# Patient Record
Sex: Male | Born: 1950 | Race: White | Hispanic: No | Marital: Single | Smoking: Never smoker
Health system: Southern US, Community
[De-identification: ages and names within clinical notes are randomized; demographics above are authoritative.]

## PROBLEM LIST (undated history)

## (undated) DIAGNOSIS — K219 Gastro-esophageal reflux disease without esophagitis: Secondary | ICD-10-CM

## (undated) DIAGNOSIS — E6609 Other obesity due to excess calories: Secondary | ICD-10-CM

## (undated) DIAGNOSIS — J189 Pneumonia, unspecified organism: Secondary | ICD-10-CM

## (undated) DIAGNOSIS — E785 Hyperlipidemia, unspecified: Secondary | ICD-10-CM

## (undated) DIAGNOSIS — I10 Essential (primary) hypertension: Secondary | ICD-10-CM

## (undated) DIAGNOSIS — M109 Gout, unspecified: Secondary | ICD-10-CM

## (undated) DIAGNOSIS — N189 Chronic kidney disease, unspecified: Secondary | ICD-10-CM

## (undated) DIAGNOSIS — J9602 Acute respiratory failure with hypercapnia: Secondary | ICD-10-CM

## (undated) HISTORY — PX: COLONOSCOPY: SHX174

## (undated) HISTORY — PX: TONSILLECTOMY: SUR1361

## (undated) HISTORY — PX: SHOULDER SURGERY: SHX246

---

## 2007-05-08 ENCOUNTER — Ambulatory Visit: Payer: Self-pay | Admitting: Gastroenterology

## 2010-07-05 ENCOUNTER — Ambulatory Visit: Payer: Self-pay | Admitting: Gastroenterology

## 2010-07-09 LAB — PATHOLOGY REPORT

## 2010-11-08 ENCOUNTER — Encounter
Admission: RE | Admit: 2010-11-08 | Discharge: 2010-11-08 | Payer: Self-pay | Source: Home / Self Care | Attending: Orthopedic Surgery | Admitting: Orthopedic Surgery

## 2010-11-29 ENCOUNTER — Ambulatory Visit: Payer: Self-pay | Admitting: Orthopedic Surgery

## 2013-09-23 ENCOUNTER — Ambulatory Visit: Payer: Self-pay | Admitting: Gastroenterology

## 2013-09-24 LAB — PATHOLOGY REPORT

## 2016-02-10 DIAGNOSIS — J9602 Acute respiratory failure with hypercapnia: Secondary | ICD-10-CM

## 2016-02-10 DIAGNOSIS — J189 Pneumonia, unspecified organism: Secondary | ICD-10-CM

## 2016-02-10 HISTORY — DX: Acute respiratory failure with hypercapnia: J96.02

## 2016-02-10 HISTORY — DX: Pneumonia, unspecified organism: J18.9

## 2016-02-23 ENCOUNTER — Inpatient Hospital Stay: Payer: No Typology Code available for payment source

## 2016-02-23 ENCOUNTER — Emergency Department: Payer: No Typology Code available for payment source

## 2016-02-23 ENCOUNTER — Inpatient Hospital Stay
Admission: EM | Admit: 2016-02-23 | Discharge: 2016-02-23 | DRG: 871 | Disposition: A | Discharge status: Other Acute Inpatient Hospital | Payer: No Typology Code available for payment source | Attending: Internal Medicine | Admitting: Internal Medicine

## 2016-02-23 ENCOUNTER — Inpatient Hospital Stay (HOSPITAL_COMMUNITY)
Admission: AD | Admit: 2016-02-23 | Discharge: 2016-03-07 | DRG: 207 | Disposition: A | Discharge status: Skilled Nursing Facility | Payer: No Typology Code available for payment source | Source: Other Acute Inpatient Hospital | Attending: Internal Medicine | Admitting: Internal Medicine

## 2016-02-23 DIAGNOSIS — Z808 Family history of malignant neoplasm of other organs or systems: Secondary | ICD-10-CM | POA: Diagnosis not present

## 2016-02-23 DIAGNOSIS — Z803 Family history of malignant neoplasm of breast: Secondary | ICD-10-CM

## 2016-02-23 DIAGNOSIS — E662 Morbid (severe) obesity with alveolar hypoventilation: Secondary | ICD-10-CM | POA: Diagnosis not present

## 2016-02-23 DIAGNOSIS — M1A9XX0 Chronic gout, unspecified, without tophus (tophi): Secondary | ICD-10-CM | POA: Diagnosis present

## 2016-02-23 DIAGNOSIS — I10 Essential (primary) hypertension: Secondary | ICD-10-CM | POA: Diagnosis present

## 2016-02-23 DIAGNOSIS — J9811 Atelectasis: Secondary | ICD-10-CM | POA: Diagnosis present

## 2016-02-23 DIAGNOSIS — J9602 Acute respiratory failure with hypercapnia: Secondary | ICD-10-CM | POA: Diagnosis not present

## 2016-02-23 DIAGNOSIS — A419 Sepsis, unspecified organism: Secondary | ICD-10-CM | POA: Diagnosis not present

## 2016-02-23 DIAGNOSIS — R6521 Severe sepsis with septic shock: Secondary | ICD-10-CM | POA: Diagnosis not present

## 2016-02-23 DIAGNOSIS — I1310 Hypertensive heart and chronic kidney disease without heart failure, with stage 1 through stage 4 chronic kidney disease, or unspecified chronic kidney disease: Secondary | ICD-10-CM | POA: Diagnosis present

## 2016-02-23 DIAGNOSIS — J9601 Acute respiratory failure with hypoxia: Secondary | ICD-10-CM | POA: Diagnosis present

## 2016-02-23 DIAGNOSIS — N179 Acute kidney failure, unspecified: Secondary | ICD-10-CM | POA: Diagnosis present

## 2016-02-23 DIAGNOSIS — E872 Acidosis: Secondary | ICD-10-CM | POA: Diagnosis present

## 2016-02-23 DIAGNOSIS — J189 Pneumonia, unspecified organism: Secondary | ICD-10-CM | POA: Diagnosis not present

## 2016-02-23 DIAGNOSIS — M109 Gout, unspecified: Secondary | ICD-10-CM | POA: Diagnosis present

## 2016-02-23 DIAGNOSIS — K5900 Constipation, unspecified: Secondary | ICD-10-CM | POA: Diagnosis present

## 2016-02-23 DIAGNOSIS — I509 Heart failure, unspecified: Secondary | ICD-10-CM | POA: Diagnosis not present

## 2016-02-23 DIAGNOSIS — N183 Chronic kidney disease, stage 3 unspecified: Secondary | ICD-10-CM | POA: Insufficient documentation

## 2016-02-23 DIAGNOSIS — J441 Chronic obstructive pulmonary disease with (acute) exacerbation: Secondary | ICD-10-CM | POA: Diagnosis present

## 2016-02-23 DIAGNOSIS — K219 Gastro-esophageal reflux disease without esophagitis: Secondary | ICD-10-CM | POA: Diagnosis present

## 2016-02-23 DIAGNOSIS — J9600 Acute respiratory failure, unspecified whether with hypoxia or hypercapnia: Secondary | ICD-10-CM | POA: Diagnosis present

## 2016-02-23 DIAGNOSIS — J440 Chronic obstructive pulmonary disease with acute lower respiratory infection: Principal | ICD-10-CM | POA: Diagnosis present

## 2016-02-23 DIAGNOSIS — E876 Hypokalemia: Secondary | ICD-10-CM

## 2016-02-23 DIAGNOSIS — Z91030 Bee allergy status: Secondary | ICD-10-CM

## 2016-02-23 DIAGNOSIS — G4733 Obstructive sleep apnea (adult) (pediatric): Secondary | ICD-10-CM | POA: Diagnosis present

## 2016-02-23 DIAGNOSIS — Z6841 Body Mass Index (BMI) 40.0 and over, adult: Secondary | ICD-10-CM

## 2016-02-23 DIAGNOSIS — E785 Hyperlipidemia, unspecified: Secondary | ICD-10-CM | POA: Diagnosis present

## 2016-02-23 DIAGNOSIS — E669 Obesity, unspecified: Secondary | ICD-10-CM | POA: Diagnosis not present

## 2016-02-23 DIAGNOSIS — Z79899 Other long term (current) drug therapy: Secondary | ICD-10-CM

## 2016-02-23 DIAGNOSIS — R0602 Shortness of breath: Secondary | ICD-10-CM

## 2016-02-23 DIAGNOSIS — I517 Cardiomegaly: Secondary | ICD-10-CM | POA: Diagnosis present

## 2016-02-23 DIAGNOSIS — J95851 Ventilator associated pneumonia: Secondary | ICD-10-CM

## 2016-02-23 DIAGNOSIS — R0902 Hypoxemia: Secondary | ICD-10-CM

## 2016-02-23 DIAGNOSIS — I129 Hypertensive chronic kidney disease with stage 1 through stage 4 chronic kidney disease, or unspecified chronic kidney disease: Secondary | ICD-10-CM | POA: Diagnosis present

## 2016-02-23 DIAGNOSIS — Z789 Other specified health status: Secondary | ICD-10-CM | POA: Diagnosis not present

## 2016-02-23 DIAGNOSIS — R5381 Other malaise: Secondary | ICD-10-CM | POA: Diagnosis not present

## 2016-02-23 DIAGNOSIS — B372 Candidiasis of skin and nail: Secondary | ICD-10-CM | POA: Diagnosis present

## 2016-02-23 DIAGNOSIS — Z978 Presence of other specified devices: Secondary | ICD-10-CM | POA: Insufficient documentation

## 2016-02-23 HISTORY — DX: Gastro-esophageal reflux disease without esophagitis: K21.9

## 2016-02-23 HISTORY — DX: Morbid (severe) obesity due to excess calories: E66.01

## 2016-02-23 HISTORY — DX: Pneumonia, unspecified organism: J18.9

## 2016-02-23 HISTORY — DX: Gout, unspecified: M10.9

## 2016-02-23 HISTORY — DX: Essential (primary) hypertension: I10

## 2016-02-23 HISTORY — DX: Chronic kidney disease, unspecified: N18.9

## 2016-02-23 HISTORY — DX: Acute respiratory failure with hypercapnia: J96.02

## 2016-02-23 LAB — BLOOD GAS, ARTERIAL
ACID-BASE EXCESS: 8.3 mmol/L — AB (ref 0.0–3.0)
ACID-BASE EXCESS: 6.8 mmol/L — AB (ref 0.0–3.0)
ALLENS TEST (PASS/FAIL): POSITIVE — AB
Acid-Base Excess: 7.7 mmol/L — ABNORMAL HIGH (ref 0.0–3.0)
Allens test (pass/fail): POSITIVE — AB
BICARBONATE: 32.4 meq/L — AB (ref 21.0–28.0)
Bicarbonate: 39.4 mEq/L — ABNORMAL HIGH (ref 21.0–28.0)
Bicarbonate: 40.7 mEq/L — ABNORMAL HIGH (ref 21.0–28.0)
EXPIRATORY PAP: 6
FIO2: 1
FIO2: 1
FIO2: 1
INSPIRATORY PAP: 18
MECHVT: 500 mL
Mechanical Rate: 22
O2 SAT: 97 %
O2 SAT: 98.9 %
O2 Saturation: 89.7 %
PATIENT TEMPERATURE: 37
PATIENT TEMPERATURE: 37
PCO2 ART: 109 mmHg — AB (ref 32.0–48.0)
PEEP/CPAP: 5 cmH2O
PH ART: 7.18 — AB (ref 7.350–7.450)
Patient temperature: 37
pCO2 arterial: 92 mmHg (ref 32.0–48.0)
pCO2 arterial: 50 mmHg — ABNORMAL HIGH (ref 32.0–48.0)
pH, Arterial: 7.24 — ABNORMAL LOW (ref 7.350–7.450)
pH, Arterial: 7.42 (ref 7.350–7.450)
pO2, Arterial: 104 mmHg (ref 83.0–108.0)
pO2, Arterial: 72 mmHg — ABNORMAL LOW (ref 83.0–108.0)
pO2, Arterial: 127 mmHg — ABNORMAL HIGH (ref 83.0–108.0)

## 2016-02-23 LAB — COMPREHENSIVE METABOLIC PANEL
ALT: 18 U/L (ref 17–63)
ANION GAP: 8 (ref 5–15)
AST: 29 U/L (ref 15–41)
Albumin: 3.9 g/dL (ref 3.5–5.0)
Alkaline Phosphatase: 77 U/L (ref 38–126)
BUN: 21 mg/dL — ABNORMAL HIGH (ref 6–20)
CHLORIDE: 98 mmol/L — AB (ref 101–111)
CO2: 34 mmol/L — AB (ref 22–32)
CREATININE: 1.31 mg/dL — AB (ref 0.61–1.24)
Calcium: 8.7 mg/dL — ABNORMAL LOW (ref 8.9–10.3)
GFR calc non Af Amer: 56 mL/min — ABNORMAL LOW (ref >60)
Glucose, Bld: 116 mg/dL — ABNORMAL HIGH (ref 65–99)
Potassium: 3.4 mmol/L — ABNORMAL LOW (ref 3.5–5.1)
SODIUM: 140 mmol/L (ref 135–145)
Total Bilirubin: 1.1 mg/dL (ref 0.3–1.2)
Total Protein: 7.4 g/dL (ref 6.5–8.1)

## 2016-02-23 LAB — CBC WITH DIFFERENTIAL/PLATELET
Basophils Absolute: 0.1 10*3/uL (ref 0–0.1)
Basophils Relative: 1 %
EOS ABS: 0.1 10*3/uL (ref 0–0.7)
EOS PCT: 1 %
HCT: 45.4 % (ref 40.0–52.0)
Hemoglobin: 14.2 g/dL (ref 13.0–18.0)
LYMPHS ABS: 1 10*3/uL (ref 1.0–3.6)
Lymphocytes Relative: 8 %
MCH: 26.8 pg (ref 26.0–34.0)
MCHC: 31.3 g/dL — ABNORMAL LOW (ref 32.0–36.0)
MCV: 85.7 fL (ref 80.0–100.0)
Monocytes Absolute: 0.8 10*3/uL (ref 0.2–1.0)
Monocytes Relative: 7 %
Neutro Abs: 9.8 10*3/uL — ABNORMAL HIGH (ref 1.4–6.5)
Neutrophils Relative %: 83 %
PLATELETS: 214 10*3/uL (ref 150–440)
RBC: 5.3 MIL/uL (ref 4.40–5.90)
RDW: 17.5 % — ABNORMAL HIGH (ref 11.5–14.5)
WBC: 11.8 10*3/uL — AB (ref 3.8–10.6)

## 2016-02-23 LAB — RAPID INFLUENZA A&B ANTIGENS (ARMC ONLY)
INFLUENZA A (ARMC): NEGATIVE
INFLUENZA B (ARMC): NEGATIVE

## 2016-02-23 LAB — TROPONIN I: Troponin I: 0.03 ng/mL (ref <0.031)

## 2016-02-23 LAB — BRAIN NATRIURETIC PEPTIDE: B NATRIURETIC PEPTIDE 5: 84 pg/mL (ref 0.0–100.0)

## 2016-02-23 LAB — LACTIC ACID, PLASMA: LACTIC ACID, VENOUS: 1.4 mmol/L (ref 0.5–2.0)

## 2016-02-23 MED ORDER — INSULIN ASPART 100 UNIT/ML ~~LOC~~ SOLN: 0.0000 [IU] | Freq: Three times a day (TID) | SUBCUTANEOUS | Status: DC

## 2016-02-23 MED ORDER — MIDAZOLAM HCL 2 MG/2ML IJ SOLN
2.0000 mg | INTRAMUSCULAR | Status: AC | PRN
  Administered 2016-02-23 (×3): 2 mg via INTRAVENOUS
  Filled 2016-02-23 (×3): qty 2

## 2016-02-23 MED ORDER — IPRATROPIUM-ALBUTEROL 0.5-2.5 (3) MG/3ML IN SOLN
3.0000 mL | Freq: Once | RESPIRATORY_TRACT | Status: AC
  Administered 2016-02-23: 3 mL via RESPIRATORY_TRACT

## 2016-02-23 MED ORDER — FENTANYL BOLUS VIA INFUSION
50.0000 ug | INTRAVENOUS | Status: DC | PRN
  Filled 2016-02-23: qty 50

## 2016-02-23 MED ORDER — FAMOTIDINE 20 MG PO TABS
20.0000 mg | ORAL_TABLET | Freq: Every day | ORAL | Status: DC
  Administered 2016-02-23: 20 mg via ORAL

## 2016-02-23 MED ORDER — PIPERACILLIN-TAZOBACTAM 3.375 G IVPB 30 MIN
3.3750 g | INTRAVENOUS | Status: AC
  Administered 2016-02-23: 3.375 g via INTRAVENOUS
  Filled 2016-02-23: qty 50

## 2016-02-23 MED ORDER — SODIUM CHLORIDE 0.9 % IV BOLUS (SEPSIS): 500.0000 mL | Freq: Once | INTRAVENOUS | Status: DC

## 2016-02-23 MED ORDER — SUCCINYLCHOLINE CHLORIDE 20 MG/ML IJ SOLN
150.0000 mg | Freq: Once | INTRAMUSCULAR | Status: AC
  Administered 2016-02-23: 150 mg via INTRAVENOUS

## 2016-02-23 MED ORDER — SODIUM CHLORIDE 0.9 % IV SOLN: 2.0000 ug/min | INTRAVENOUS | Status: DC

## 2016-02-23 MED ORDER — IOPAMIDOL (ISOVUE-370) INJECTION 76%
100.0000 mL | Freq: Once | INTRAVENOUS | Status: AC | PRN
  Administered 2016-02-23: 100 mL via INTRAVENOUS

## 2016-02-23 MED ORDER — SENNOSIDES 8.8 MG/5ML PO SYRP
5.0000 mL | ORAL_SOLUTION | Freq: Two times a day (BID) | ORAL | Status: DC | PRN
  Filled 2016-02-23: qty 5

## 2016-02-23 MED ORDER — FENTANYL 2500MCG IN NS 250ML (10MCG/ML) PREMIX INFUSION
25.0000 ug/h | INTRAVENOUS | Status: DC
  Administered 2016-02-23: 125 ug/h via INTRAVENOUS

## 2016-02-23 MED ORDER — MIDAZOLAM HCL 2 MG/2ML IJ SOLN: 2.0000 mg | INTRAMUSCULAR | Status: DC | PRN

## 2016-02-23 MED ORDER — IPRATROPIUM-ALBUTEROL 0.5-2.5 (3) MG/3ML IN SOLN
RESPIRATORY_TRACT | Status: AC
  Administered 2016-02-23: 3 mL
  Filled 2016-02-23: qty 3

## 2016-02-23 MED ORDER — IPRATROPIUM-ALBUTEROL 0.5-2.5 (3) MG/3ML IN SOLN
RESPIRATORY_TRACT | Status: AC
  Administered 2016-02-23: 3 mL via RESPIRATORY_TRACT
  Filled 2016-02-23: qty 3

## 2016-02-23 MED ORDER — FENTANYL 2500MCG IN NS 250ML (10MCG/ML) PREMIX INFUSION: 25.0000 ug/h | INTRAVENOUS | Status: DC

## 2016-02-23 MED ORDER — PROPOFOL 1000 MG/100ML IV EMUL
5.0000 ug/kg/min | Freq: Once | INTRAVENOUS | Status: AC
  Administered 2016-02-23: 80 ug/kg/min via INTRAVENOUS

## 2016-02-23 MED ORDER — ETOMIDATE 2 MG/ML IV SOLN
30.0000 mg | Freq: Once | INTRAVENOUS | Status: AC
  Administered 2016-02-23: 30 mg via INTRAVENOUS

## 2016-02-23 MED ORDER — FENTANYL CITRATE (PF) 100 MCG/2ML IJ SOLN
50.0000 ug | Freq: Once | INTRAMUSCULAR | Status: AC
  Administered 2016-02-23: 50 ug via INTRAVENOUS
  Filled 2016-02-23: qty 2

## 2016-02-23 MED ORDER — HYDRALAZINE HCL 20 MG/ML IJ SOLN: 10.0000 mg | Freq: Four times a day (QID) | INTRAMUSCULAR | Status: DC | PRN

## 2016-02-23 MED ORDER — MIDAZOLAM HCL 5 MG/5ML IJ SOLN
INTRAMUSCULAR | Status: AC
  Administered 2016-02-23: 2 mg via INTRAVENOUS
  Filled 2016-02-23: qty 5

## 2016-02-23 MED ORDER — ONDANSETRON HCL 4 MG/2ML IJ SOLN
INTRAMUSCULAR | Status: AC
  Administered 2016-02-23: 4 mg via INTRAVENOUS
  Filled 2016-02-23: qty 2

## 2016-02-23 MED ORDER — SODIUM CHLORIDE 0.9 % IV BOLUS (SEPSIS)
1000.0000 mL | Freq: Once | INTRAVENOUS | Status: AC
  Administered 2016-02-23: 1000 mL via INTRAVENOUS

## 2016-02-23 MED ORDER — DEXTROSE 5 % IV SOLN
1.0000 g | Freq: Once | INTRAVENOUS | Status: AC
  Administered 2016-02-23: 1 g via INTRAVENOUS
  Filled 2016-02-23: qty 10

## 2016-02-23 MED ORDER — FENTANYL CITRATE (PF) 100 MCG/2ML IJ SOLN
100.0000 ug | Freq: Once | INTRAMUSCULAR | Status: AC
  Administered 2016-02-23: 100 ug via INTRAVENOUS

## 2016-02-23 MED ORDER — SODIUM CHLORIDE 0.9 % IV BOLUS (SEPSIS)
500.0000 mL | Freq: Once | INTRAVENOUS | Status: AC
  Administered 2016-02-23: 500 mL via INTRAVENOUS

## 2016-02-23 MED ORDER — FENTANYL 2500MCG IN NS 250ML (10MCG/ML) PREMIX INFUSION
25.0000 ug/h | INTRAVENOUS | Status: DC
  Administered 2016-02-23: 50 ug/h via INTRAVENOUS
  Filled 2016-02-23: qty 250

## 2016-02-23 MED ORDER — ONDANSETRON HCL 4 MG/2ML IJ SOLN
4.0000 mg | Freq: Once | INTRAMUSCULAR | Status: AC
  Administered 2016-02-23: 4 mg via INTRAVENOUS

## 2016-02-23 MED ORDER — ALLOPURINOL 300 MG PO TABS
150.0000 mg | ORAL_TABLET | Freq: Every day | ORAL | Status: DC
  Filled 2016-02-23: qty 0.5

## 2016-02-23 MED ORDER — AMLODIPINE BESYLATE 5 MG PO TABS
5.0000 mg | ORAL_TABLET | Freq: Every day | ORAL | Status: DC
  Administered 2016-02-23: 5 mg via ORAL

## 2016-02-23 MED ORDER — HEPARIN SODIUM (PORCINE) 5000 UNIT/ML IJ SOLN: 5000.0000 [IU] | Freq: Three times a day (TID) | INTRAMUSCULAR | Status: DC

## 2016-02-23 MED ORDER — MIDAZOLAM HCL 2 MG/2ML IJ SOLN
2.0000 mg | INTRAMUSCULAR | Status: DC | PRN
  Administered 2016-02-23: 2 mg via INTRAVENOUS

## 2016-02-23 MED ORDER — PIPERACILLIN-TAZOBACTAM 3.375 G IVPB: 3.3750 g | Freq: Three times a day (TID) | INTRAVENOUS | Status: DC

## 2016-02-23 MED ORDER — FENTANYL BOLUS VIA INFUSION
50.0000 ug | INTRAVENOUS | Status: DC | PRN
  Administered 2016-02-23: 50 ug via INTRAVENOUS
  Filled 2016-02-23: qty 50

## 2016-02-23 MED ORDER — BISACODYL 10 MG RE SUPP
10.0000 mg | Freq: Every day | RECTAL | Status: DC | PRN
  Filled 2016-02-23: qty 1

## 2016-02-23 MED ORDER — PROPOFOL 1000 MG/100ML IV EMUL
INTRAVENOUS | Status: AC
  Administered 2016-02-23: 80 ug/kg/min via INTRAVENOUS
  Filled 2016-02-23: qty 100

## 2016-02-23 MED ORDER — DEXTROSE 5 % IV SOLN
500.0000 mg | Freq: Once | INTRAVENOUS | Status: AC
  Administered 2016-02-23: 500 mg via INTRAVENOUS
  Filled 2016-02-23: qty 500

## 2016-02-23 MED ORDER — VANCOMYCIN HCL 10 G IV SOLR
1250.0000 mg | INTRAVENOUS | Status: AC
  Administered 2016-02-23: 1250 mg via INTRAVENOUS
  Filled 2016-02-23: qty 1250

## 2016-02-23 MED ORDER — VANCOMYCIN HCL 10 G IV SOLR
1250.0000 mg | Freq: Two times a day (BID) | INTRAVENOUS | Status: DC
  Filled 2016-02-23 (×2): qty 1250

## 2016-02-23 NOTE — ED Provider Notes (Addendum)
[facility] Emergency Department Provider Note  ____________________________________________  Time seen: Approximately 12:17 PM  I have reviewed the triage vital signs and the nursing notes.   HISTORY  Chief Complaint Shortness of Breath    HPI Dylan Kennedy is a 65 y.o. male with history of hypertension, hyperlipidemia, gout who presents for evaluation of 4 days shortness of breath worse with exertion, intermittent left chest soreness, subjective fevers, gradual onset, constant since onset, currently severe. Currently his chest pain has resolved. No abdominal pain, vomiting, diarrhea, fevers or chills.   Past Medical History  Diagnosis Date  . Hypertension   . Gout   . GERD (gastroesophageal reflux disease)   . Morbid obesity [facility])     Patient Active Problem List   Diagnosis Date Noted  . Acute respiratory failure (HCC) 02/23/2016    Past Surgical History  Procedure Laterality Date  . Tonsillectomy    . Shoulder surgery      right  . Colonoscopy      Current Outpatient Rx  Name  Route  Sig  Dispense  Refill  . acetaminophen (TYLENOL) 500 MG tablet   Oral   Take 1,000 mg by mouth every 6 (six) hours as needed for mild pain or headache.         . allopurinol (ZYLOPRIM) 300 MG tablet   Oral   Take 300 mg by mouth every evening.         . cholecalciferol (VITAMIN D) 1000 units tablet   Oral   Take 2,000 Units by mouth every evening.         Marland Kitchen lisinopril (PRINIVIL,ZESTRIL) 10 MG tablet   Oral   Take 10 mg by mouth every evening.         Marland Kitchen lisinopril-hydrochlorothiazide (PRINZIDE,ZESTORETIC) 20-12.5 MG tablet   Oral   Take 1 tablet by mouth every evening.         . pantoprazole (PROTONIX) 40 MG tablet   Oral   Take 40 mg by mouth every evening.           Allergies Bee venom  Family History  Problem Relation Age of Onset  . Breast cancer Mother   . Bone cancer Father     Social History Social History   Substance Use Topics  . Smoking status: Never Smoker   . Smokeless tobacco: None  . Alcohol Use: No    Review of Systems Constitutional: + fever/chills Eyes: No visual changes. ENT: No sore throat. Cardiovascular: +chest pain. Respiratory: + shortness of breath. Gastrointestinal: No abdominal pain.  No nausea, no vomiting.  No diarrhea.  No constipation. Genitourinary: Negative for dysuria. Musculoskeletal: Negative for back pain. Skin: Negative for rash. Neurological: Negative for headaches, focal weakness or numbness.  10-point ROS otherwise negative.  ____________________________________________   PHYSICAL EXAM:  VITAL SIGNS: ED Triage Vitals  Enc Vitals Group     BP 02/23/16 1208 169/87 mmHg     Pulse Rate 02/23/16 1208 84     Resp 02/23/16 1208 16     Temp 02/23/16 1208 97.5 F (36.4 C)     Temp Source 02/23/16 1208 Oral     SpO2 02/23/16 1208 63 %     Weight 02/23/16 1208 310 lb (140.615 kg)     Height 02/23/16 1208  (1.753 m)     Head Cir --      Peak Flow --      Pain Score 02/23/16 1209 8     Pain Loc --  Pain Edu? --      Excl. in GC? --     Constitutional: Alert and oriented. Mild tachypnea with increased work of breathing but able to speak in short sentences. Eyes: Conjunctivae are normal. PERRL. EOMI. Head: Atraumatic. Nose: No congestion/rhinnorhea. Mouth/Throat: Mucous membranes are moist.  Oropharynx non-erythematous. Neck: No stridor.  Supple without meningismus. Cardiovascular: Normal rate, regular rhythm. Grossly normal heart sounds.  Good peripheral circulation. Respiratory: Mild tachypnea with increased work of breathing. Globally diminished breath sounds with faint wheeze in the right mid lung fields. Gastrointestinal: Soft and nontender. No distention. No CVA tenderness. Genitourinary: deferred Musculoskeletal: No lower extremity tenderness nor edema.  No joint effusions. Neurologic:  Normal speech and language. No gross focal  neurologic deficits are appreciated.  Skin:  Skin is warm, dry and intact. No rash noted. Psychiatric: Mood and affect are normal. Speech and behavior are normal.  ____________________________________________   LABS (all labs ordered are listed, but only abnormal results are displayed)  Labs Reviewed  CBC WITH DIFFERENTIAL/PLATELET - Abnormal; Notable for the following:    WBC 11.8 (*)    MCHC 31.3 (*)    RDW 17.5 (*)    Neutro Abs 9.8 (*)    All other components within normal limits  COMPREHENSIVE METABOLIC PANEL - Abnormal; Notable for the following:    Potassium 3.4 (*)    Chloride 98 (*)    CO2 34 (*)    Glucose, Bld 116 (*)    BUN 21 (*)    Creatinine, Ser 1.31 (*)    Calcium 8.7 (*)    GFR calc non Af Amer 56 (*)    All other components within normal limits  BLOOD GAS, ARTERIAL - Abnormal; Notable for the following:    pH, Arterial 7.24 (*)    pCO2 arterial 92 (*)    Bicarbonate 39.4 (*)    Acid-Base Excess 8.3 (*)    Allens test (pass/fail) POSITIVE (*)    All other components within normal limits  BLOOD GAS, ARTERIAL - Abnormal; Notable for the following:    pH, Arterial 7.18 (*)    pCO2 arterial 109 (*)    pO2, Arterial 72 (*)    Bicarbonate 40.7 (*)    Acid-Base Excess 7.7 (*)    Allens test (pass/fail) POSITIVE (*)    All other components within normal limits  RAPID INFLUENZA A&B ANTIGENS ([facility] ONLY)  CULTURE, BLOOD (ROUTINE X 2)  CULTURE, BLOOD (ROUTINE X 2)  MRSA PCR SCREENING  CULTURE, EXPECTORATED SPUTUM-ASSESSMENT  TROPONIN I  BRAIN NATRIURETIC PEPTIDE  LACTIC ACID, PLASMA  INFLUENZA PANEL BY PCR (TYPE A & B, H1N1)  STREP PNEUMONIAE URINARY ANTIGEN  BLOOD GAS, ARTERIAL  BLOOD GAS, ARTERIAL   ____________________________________________  EKG  ED ECG REPORT I, Gayla DossGayle, Stavros Cail A, the attending physician, personally viewed and interpreted this ECG.   Date: 02/23/2016  EKG Time: 12:26  Rate: 78  Rhythm: normal sinus rhythm  Axis: right   Intervals:none  ST&T Change: No acute ST elevation.  ____________________________________________  RADIOLOGY  CXR  IMPRESSION: 1. Prominent superior mediastinum on the right. This could be due to vascular tortuosity or a mass. Follow-up PA and lateral chest radiographs recommended when possible. 2. Right middle lobe atelectasis. Underlying pneumonia cannot be excluded. 3. Loculated pleural fluid or prominent epicardial fat pad at the left lateral lung base. 4. Cardiomegaly.  CTA chest IMPRESSION: No evidence of pulmonary emboli. No evidence of aortic dissection or aneurysmal dilatation.  Multifocal infiltrates bilaterally with some  tree-in-bud appearance. This may be related to an atypical pneumonia such as MAI.  Some mild lymph nodes are noted within the right hilar region and mediastinum on the right likely of reactive nature.   CXR IMPRESSION: ETT terminates above the thoracic inlet. Recommend advancement and repeat radiograph.  Cardiomegaly.  Multi focal pulmonary opacities may represent combination of edema and or infection.  These results will be called to the ordering clinician or representative by the Radiologist Assistant, and communication documented in the PACS or zVision Dashboard.   CXR IMPRESSION: 1. Endotracheal tube in satisfactory position. 2. Mild increased bilateral alveolar edema or pneumonia. 3. Mildly increased left pleural fluid.  ____________________________________________   PROCEDURES  Procedure(s) performed:   INTUBATION Performed by: Toney Rakes A  Required items: required blood products, implants, devices, and special equipment available Patient identity confirmed: provided demographic data and hospital-assigned identification number Time out: Immediately prior to procedure a "time out" was called to verify the correct patient, procedure, equipment, support staff and site/side marked as required.  Indications: Hypoxic  hypercarbic respiratory failure   Intubation method: Glidescope Laryngoscopy   Preoxygenation: BVM  Sedatives: Etomidate Paralytic: Succinylcholine  Tube Size: 8.0 cuffed  Post-procedure assessment: chest rise and ETCO2 monitor Breath sounds: equal and absent over the epigastrium Tube secured with: ETT holder Chest x-ray interpreted by radiologist and me.  Chest x-ray findings: endotracheal tube in trachea, recommend advancement.   Patient tolerated the procedure well with no immediate complications.     Critical Care performed: Yes, see critical care note(s). Total critical care time spent 60 minutes.  ____________________________________________   INITIAL IMPRESSION / ASSESSMENT AND PLAN / ED COURSE  Pertinent labs & imaging results that were available during my care of the patient were reviewed by me and considered in my medical decision making (see chart for details).  Angelos Wasco is a 65 y.o. male with history of hypertension, hyperlipidemia, gout who presents for evaluation of 4 days shortness of breath worse with exertion, intermittent left chest soreness, subjective fevers. On arrival to the emergency department, his O2 saturation is 63% on 3 L via nasal cannula, he was placed on nonrebreather and is now satting 100%. Lungs are globally diminished, there is a faint wheeze in the right mid lung fields, will give DuoNeb treatment. We'll obtain screening labs, chest x-ray, anticipate admission.  ----------------------------------------- 1:33 PM on 02/23/2016 ----------------------------------------- Patient appears comfortable on nonrebreather. Chest x-ray shows right middle lobe atelectasis but pneumonia cannot be excluded. We'll give IV fluids, ceftriaxone and azithromycin. There is also question of a left loculated pleural effusion versus prominent epicardial fat pad therefore will obtain CT scan of the chest to further  differentiate.  ----------------------------------------- 3:32 PM on 02/23/2016 ----------------------------------------- CTA chest shows multifocal pneumonia, no empyema. I discussed the patient's ABG with Dr. Nicholos Johns of ICU. As the patient remains awake and alert, he recommends trial of BiPAP with repeat ABG in an hour. He does not recommend intubation at this time as the patient is mentating appropriately. I agree. I discussed the case with the hospitalist, Dr. Mack Hook for admission at this time. BiPAP ordered.  ----------------------------------------- 6:42 PM on 02/23/2016 ----------------------------------------- Patient had worsening acidosis and hypercarbia on his repeat ABG despite BiPAP. I have intubated the patient per our discussion and at the request of Dr. Nicholos Johns of the ICU. I did obtain verbal consent from the patient and we discussed risks and benefits prior to intubation. Repeat chest x-ray shows ET tube above the thoracic inlet in the proximal  trachea however repeat chest x-ray showed tube in the appropriate position.  ____________________________________________   FINAL CLINICAL IMPRESSION(S) / ED DIAGNOSES  Final diagnoses:  SOB (shortness of breath)  Hypoxia  Community acquired pneumonia  Acute respiratory failure with hypoxia (HCC)      Gayla Doss, MD 02/23/16 1534  Gayla Doss, MD 02/23/16 2001  Gayla Doss, MD 02/23/16 2025

## 2016-02-23 NOTE — ED Notes (Signed)
Pt reports shortness of breath x4 days. Pt reports left sided breast soreness.

## 2016-02-23 NOTE — Progress Notes (Signed)
Pharmacy Antibiotic Note  Peyton BottomsJames Hudon is a 65 y.o. male admitted on 02/23/2016 with pneumonia.  Pharmacy has been consulted for vancomycin and Zosyn dosing. Patient previously received azithromycin and ceftriaxone in ED.   Plan: Vancomycin 1250 IV every 12 hours with stacked dosing and a trough with the 5th dose.  Goal trough 15-20 mcg/mL. Zosyn 3.375g IV q8h (4 hour infusion).  Height: 5\' 9"  (175.3 cm) Weight: (!) 310 lb (140.615 kg) IBW/kg (Calculated) : 70.7  Temp (24hrs), Avg:97.5 F (36.4 C), Min:97.5 F (36.4 C), Max:97.5 F (36.4 C)   Recent Labs Lab 02/23/16 1220 02/23/16 1339  WBC 11.8*  --   CREATININE 1.31*  --   LATICACIDVEN  --  1.4    Estimated Creatinine Clearance: 80.6 mL/min (by C-G formula based on Cr of 1.31).    Allergies  Allergen Reactions  . Bee Venom Hives and Itching    Antimicrobials this admission: azithromyicn 4/14 >> 4/14 ceftriaxone 4/14 >> 4/14 Vancomycin 4/14 >> Zosyn 4/14 >>  Dose adjustments this admission:   Microbiology results: 4/14 BCx: pending 4/14 Rapid influenza: negative  Thank you for allowing pharmacy to be a part of this patient's care.  Luisa HartChristy, Amish Mintzer D 02/23/2016 4:02 PM

## 2016-02-23 NOTE — Progress Notes (Signed)
Notified by Pola CornELink that patient is hypotensive with systolic BP in the low 80s hence will need a central venous catheter. Upon assessment, patient's SBP was in the low 90s after 4L of NS. A 500cc bolus of NS given and sedation changed from propofol to fentanyl gtte and prn versed. Blood pressure improved to 102/65. Right CVL placed. CXR reviewed and reveals appropriate placement. Levophed ordered to maintain MAP>65 and an additional bolus of NS 500cc given. Patient's blood pressure stabilized at ~108/80. Elink and carelink contacted regarding patient's transfer. Bed assignment confirmed and patient was to be picked up at ~2230. Family updated at bedside.     Blood pressure 115/65, pulse 54, temperature 97.5 F (36.4 C), temperature source Oral, resp. rate 22, height 5\' 9"  (1.753 m), weight 310 lb (140.615 kg), SpO2 98 %.  Dr. Nicholos Johnsamachandran updated.  Wanona Stare S. [facility] ANP-BC Pulmonary and Critical Care Medicine [facility] Pager [phone]

## 2016-02-23 NOTE — ED Notes (Signed)
Respiratory called to see if BiPap setting may need adjusting since O2 sats have been dipping into 80s since pt has fallen asleep. Respiratory said they will come assess patient.

## 2016-02-23 NOTE — Procedures (Signed)
Central Venous Catheter Insertion Procedure Note Dylan BottomsJames Kennedy [id number] 05/20/1951  Procedure: Insertion of Central Venous Catheter Indications: Assessment of intravascular volume, Drug and/or fluid administration and Frequent blood sampling  Procedure Details Consent: Risks of procedure as well as the alternatives and risks of each were explained to the (patient/caregiver).  Consent for procedure obtained. Time Out: Verified patient identification, verified procedure, site/side was marked, verified correct patient position, special equipment/implants available, medications/allergies/relevent history reviewed, required imaging and test results available.  Performed  Maximum sterile technique was used including antiseptics, cap, gloves, gown, hand hygiene, mask and sheet. Skin prep: Chlorhexidine; local anesthetic administered A antimicrobial bonded/coated triple lumen catheter was placed in the right internal jugular vein using the Seldinger technique.  Evaluation Blood flow good Complications: No apparent complications Patient did tolerate procedure well. Chest X-ray ordered to verify placement.  CXR: normal.  Procedure performed under direct supervision of Dr. Nicholos Kennedy. Ultrasound utilized for realtime vessel cannulation  Magdalene S. [facility] ANP-BC Pulmonary and Critical Care Medicine [facility] Pager [phone] 02/23/2016, 9:49 PM

## 2016-02-23 NOTE — ED Notes (Signed)
Pt intubated without difficulty. Vital signs remained stable.

## 2016-02-23 NOTE — H&P (Signed)
[facility] [place] [facility]   PATIENT NAME: Dylan BottomsJames Kennedy    MR#:  [id number]  DATE OF BIRTH:  01/15/1951  DATE OF ADMISSION:  02/23/2016  PRIMARY CARE PHYSICIAN: Dr. Clydie Braunavid Fitzgerald  REQUESTING/REFERRING PHYSICIAN: Dr. Toney RakesEryka Gayle  CHIEF COMPLAINT:   Chief Complaint  Patient presents with  . Shortness of Breath    HISTORY OF PRESENT ILLNESS:  Dylan Kennedy  is a 65 y.o. male with a known history of hypertension, morbid obesity and gout presents to the hospital secondary to worsening shortness of breath going on for 4 days now. Patient is currently hypoxic and placed on nonrebreather mask. Most of the history is obtained from his brother-in-law at bedside. Apparently patient at baseline is able to ambulate with the help of a cane. He was never diagnosed with obstructive sleep apnea or any other lung conditions. Never been a smoker. He started having chills and respiratory symptoms about 4 days ago. Were not improving and so presented to the emergency room today. Did not check his temperature at home. Denies any nausea or vomiting. Also complains of some chest pain on deep breathing at the bases. CT of the chest here showed that he has multifocal pneumonia. No pulmonary embolism. He was hypoxic and requiring nonrebreather mask. During my exam patient is still alert and oriented. Falling back to sleep intermittently. ABG showed respiratory acidosis.  PAST MEDICAL HISTORY:   Past Medical History  Diagnosis Date  . Hypertension   . Gout   . GERD (gastroesophageal reflux disease)   . Morbid obesity (HCC)     PAST SURGICAL HISTORY:   Past Surgical History  Procedure Laterality Date  . Tonsillectomy    . Shoulder surgery      right  . Colonoscopy      SOCIAL HISTORY:   Social History  Substance Use Topics  . Smoking status: Never Smoker   . Smokeless tobacco: Not on file  . Alcohol Use: No    FAMILY HISTORY:   Family History   Problem Relation Age of Onset  . Breast cancer Mother   . Bone cancer Father     DRUG ALLERGIES:   Allergies  Allergen Reactions  . Bee Venom Hives and Itching    REVIEW OF SYSTEMS:   Review of Systems  Unable to perform ROS: critical illness    MEDICATIONS AT HOME:   Prior to Admission medications   Medication Sig Start Date End Date Taking? Authorizing Provider  acetaminophen (TYLENOL) 500 MG tablet Take 1,000 mg by mouth every 6 (six) hours as needed for mild pain or headache.   Yes Historical Provider, MD  allopurinol (ZYLOPRIM) 300 MG tablet Take 300 mg by mouth every evening.   Yes Historical Provider, MD  cholecalciferol (VITAMIN D) 1000 units tablet Take 2,000 Units by mouth every evening.   Yes Historical Provider, MD  lisinopril (PRINIVIL,ZESTRIL) 10 MG tablet Take 10 mg by mouth every evening.   Yes Historical Provider, MD  lisinopril-hydrochlorothiazide (PRINZIDE,ZESTORETIC) 20-12.5 MG tablet Take 1 tablet by mouth every evening.   Yes Historical Provider, MD  pantoprazole (PROTONIX) 40 MG tablet Take 40 mg by mouth every evening.   Yes Historical Provider, MD      VITAL SIGNS:  Blood pressure 140/76, pulse 78, temperature 97.5 F (36.4 C), temperature source Oral, resp. rate 28, height 5\' 9"  (1.753 m), weight 140.615 kg (310 lb), SpO2 95 %.  PHYSICAL EXAMINATION:   Physical Exam  GENERAL:  65 y.o.-year-old  morbidly obese patient sitting in the bed, appears dyspneic  EYES: Pupils equal, round, reactive to light and accommodation. No scleral icterus. Extraocular muscles intact. Erythematous conjunctiva HEENT: Head atraumatic, normocephalic. Oropharynx and nasopharynx clear.  NECK:  Very SHORT AND THICK, supple, no jugular venous distention. No thyroid enlargement, no tenderness.  LUNGS: scant breath sounds, scattered wheezes and rhonchi at the bases. On NRB, using accessory muscles to breathe CARDIOVASCULAR: S1, S2 normal. No murmurs, rubs, or gallops.   ABDOMEN: Soft, nontender, nondistended. Bowel sounds present. No organomegaly or mass.  EXTREMITIES: No cyanosis, or clubbing. 1+ pedal edema NEUROLOGIC: Alert and following commands. Able to move all extremities. PSYCHIATRIC: The patient is alert and oriented x 3 at this time.  SKIN: No obvious rash, lesion, or ulcer.   LABORATORY PANEL:   CBC  Recent Labs Lab 02/23/16 1220  WBC 11.8*  HGB 14.2  HCT 45.4  PLT 214   ------------------------------------------------------------------------------------------------------------------  Chemistries   Recent Labs Lab 02/23/16 1220  NA 140  K 3.4*  CL 98*  CO2 34*  GLUCOSE 116*  BUN 21*  CREATININE 1.31*  CALCIUM 8.7*  AST 29  ALT 18  ALKPHOS 77  BILITOT 1.1   ------------------------------------------------------------------------------------------------------------------  Cardiac Enzymes  Recent Labs Lab 02/23/16 1220  TROPONINI 0.03   ------------------------------------------------------------------------------------------------------------------  RADIOLOGY:  Ct Angio Chest Pe W/cm &/or Wo Cm  02/23/2016  CLINICAL DATA:  Shortness of breath for 4 days and left-sided chest pain, initial encounter EXAM: CT ANGIOGRAPHY CHEST WITH CONTRAST TECHNIQUE: Multidetector CT imaging of the chest was performed using the standard protocol during bolus administration of intravenous contrast. Multiplanar CT image reconstructions and MIPs were obtained to evaluate the vascular anatomy. CONTRAST:  100 mL Isovue 370. COMPARISON:  None. FINDINGS: Lungs are well aerated bilaterally. Patchy multifocal infiltrates are noted throughout both lungs some of the infiltrates have a tree-in-bud appearance consistent with atypical infection. No focal mass lesion is noted. Small left pleural effusion is seen. This is not amenable to percutaneous drainage. The thoracic inlet is within normal limits. The thoracic aorta and its branches are  unremarkable. The pulmonary artery demonstrates a normal branching pattern without definitive filling defect to suggest pulmonary embolism. The prominent mediastinum is related to some degree of mediastinal lipomatosis. There is a 17 mm short axis lymph node identified adjacent to the esophagus and trachea on the right best seen on image number 55 of series 5. No other significant mediastinal lymph nodes are seen. Some small hilar lymph nodes are noted likely of reactive nature. The visualized upper abdomen shows evidence of cholelithiasis without complicating factors. The osseous structures show degenerative change of the thoracic spine. Review of the MIP images confirms the above findings. IMPRESSION: No evidence of pulmonary emboli. No evidence of aortic dissection or aneurysmal dilatation. Multifocal infiltrates bilaterally with some tree-in-bud appearance. This may be related to an atypical pneumonia such as MAI. Some mild lymph nodes are noted within the right hilar region and mediastinum on the right likely of reactive nature. Electronically Signed   By: Alcide Clever M.D.   On: 02/23/2016 14:45   Dg Chest Portable 1 View  02/23/2016  CLINICAL DATA:  Shortness of breath for the past 4 days, severe. Left breast pain. Obesity. EXAM: PORTABLE CHEST 1 VIEW COMPARISON:  None. FINDINGS: Enlarged cardiac silhouette. Vascular tortuosity with prominent superior mediastinal density on the right. Right middle lobe atelectasis. Loculated pleural fluid or prominent epicardial fat pad at the left lateral lung base. IMPRESSION: 1. Prominent superior  mediastinum on the right. This could be due to vascular tortuosity or a mass. Follow-up PA and lateral chest radiographs recommended when possible. 2. Right middle lobe atelectasis. Underlying pneumonia cannot be excluded. 3. Loculated pleural fluid or prominent epicardial fat pad at the left lateral lung base. 4. Cardiomegaly. Electronically Signed   By: Beckie Salts M.D.    On: 02/23/2016 12:57    EKG:   Orders placed or performed during the hospital encounter of 02/23/16  . ED EKG  . ED EKG    IMPRESSION AND PLAN:   Dylan Kennedy  is a 65 y.o. male with a known history of hypertension, morbid obesity and gout presents to the hospital secondary to worsening shortness of breath going on for 4 days now.  #1 acute hypoxic respiratory failure-secondary to multifocal pneumonia as noted on CT chest. Also likely has underlying obstructive sleep apnea. -Hypoxic on nonrebreather and also respiratory acidosis on ABG. -Admit to stepdown. Pulmonary consult. BiPAP and repeat ABG in 1 hour. -Vancomycin and Zosyn. Blood cultures done. -Nebulizer treatments.  #2 respiratory acidosis-started on BiPAP. Repeat ABG in 2 hours. -Management per pulmonologist  #3 acute renal failure-gentle hydration. Check echocardiogram to rule out any underlying diastolic CHF. -Avoid nephrotoxins  #4 hypertension-IV hydralazine when necessary. Oral Norvasc and be added  #5 gout-decreased allopurinol dose  #6 GERD-Protonix daily  #7 DVT prophylaxis-subcutaneous heparin    All the records are reviewed and case discussed with ED provider. Management plans discussed with the patient, family and they are in agreement.  CODE STATUS:  Full Code  TOTAL CRITICAL CARE TIME SPENT IN TAKING CARE OF THIS PATIENT: 60 minutes.    Enid Baas M.D on 02/23/2016 at 3:43 PM  Between 7am to 6pm - Pager - [phone]  After 6pm go to [email or web address] - password [email or web address]  [place] [facility]  Office  [phone]  CC: Primary care physician; No primary care provider on file.

## 2016-02-23 NOTE — Progress Notes (Addendum)
Repeat ABG while on Bipap showed no improvement with continued reduced mental status. Discussed with ED, and agree with intubation,  pt was intubated by ED.  Unfortunately there are no longer any ICU beds available at [facility], therefore [facility], [facility] and brother at bedside.   Pt currently vital stable, BP 95/70 after intubation and being started on propofol. Currently has 3- 20g peripherals in with propofol infusing. CXR reviewed, ETT appeared high with audible gurgling, RT advanced ETT by 4 cm and resolution of gurgling.   I called and informed Elink of need [facility]  and [facility] as well as pt info.   Wells Guiles-Deep Jaxzen Vanhorn, M.D.  02/23/2016

## 2016-02-23 NOTE — ED Notes (Signed)
Respiratory called with critical results from ABG. Decision made to intubate pt. Risks and benefits discussed with patient and family.

## 2016-02-23 NOTE — Consult Note (Addendum)
[facility]  Critical Care Medicine Consultation     ASSESSMENT/PLAN    PULMONARY A: Acute hypoxic and hypercapnic respiratory failure, likely secondary to pneumonia with sepsis. 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 -Pulmonary restriction due to morbid obesity, as well as mediastinal adiposity. P:   -Antibiotics for community-acquired pneumonia. -Check lactic acid, MRSA screen, influenza screen, streptococcal antigen.  CARDIOVASCULAR A: sepsis.    RENAL A:  AKI.  -Secondary to sepsis. -We'll monitor renal function and urine output, the patient has undergone a CT of the chest with contrast, therefore, he may have worsening acute kidney injury.  GASTROINTESTINAL A:   initial presentation of nausea, appears improved. P:    Continue antiemetics.  HEMATOLOGIC A:  --  INFECTIOUS A:   sepsis as above.  Micro/culture results:  BCx4/14: Pending. UC -- SpuPending.  Antibiotics: Zosyn 4/14>> Vancomycin 4/14>>     ENDOCRINE A: Will monitor blood glucose levels.  NEUROLOGIC A:--    MAJOR EVENTS/TEST RESULTS:   Best Practices  DVT Prophylaxis: Heparin sq.  GI Prophylaxis: famotidine.    ---------------------------------------  ---------------------------------------   Name: Dylan Kennedy MRN: [id number] DOB: 02/19/1951    ADMISSION DATE:  02/23/2016 CONSULTATION DATE:  02/23/16  REFERRING MD :  Dr. Flo ShanksKalisettin  CHIEF COMPLAINT:  Dyspnea.    HISTORY OF PRESENT ILLNESS:   The patient is a 65 year old morbidly obese male presents with the 3-4 days of aggressive dyspnea, cough, fevers. These symptoms were insidious in onset and slowly progressed over the last week, until finally he presented to the hospital. The patient is currently on a Ventimask, and short winded with lethargy, therefore, all history was obtained from the chart and from staff.   PAST MEDICAL HISTORY :  Past Medical History  Diagnosis Date  . Hypertension     . Gout   . GERD (gastroesophageal reflux disease)   . Morbid obesity [facility](HCC)    Past Surgical History  Procedure Laterality Date  . Tonsillectomy    . Shoulder surgery      right  . Colonoscopy     Prior to Admission medications   Medication Sig Start Date End Date Taking? Authorizing Provider  acetaminophen (TYLENOL) 500 MG tablet Take 1,000 mg by mouth every 6 (six) hours as needed for mild pain or headache.   Yes Historical Provider, MD  allopurinol (ZYLOPRIM) 300 MG tablet Take 300 mg by mouth every evening.   Yes Historical Provider, MD  cholecalciferol (VITAMIN D) 1000 units tablet Take 2,000 Units by mouth every evening.   Yes Historical Provider, MD  lisinopril (PRINIVIL,ZESTRIL) 10 MG tablet Take 10 mg by mouth every evening.   Yes Historical Provider, MD  lisinopril-hydrochlorothiazide (PRINZIDE,ZESTORETIC) 20-12.5 MG tablet Take 1 tablet by mouth every evening.   Yes Historical Provider, MD  pantoprazole (PROTONIX) 40 MG tablet Take 40 mg by mouth every evening.   Yes Historical Provider, MD   Allergies  Allergen Reactions  . Bee Venom Hives and Itching    FAMILY HISTORY:  Family History  Problem Relation Age of Onset  . Breast cancer Mother   . Bone cancer Father    SOCIAL HISTORY:  reports that he has never smoked. He does not have any smokeless tobacco history on file. He reports that he does not drink alcohol or use illicit drugs.  REVIEW OF SYSTEMS:   The patient is currently dyspneic, and cannot provide history or review of systems.    VITAL SIGNS: Temp:  [97.5 F (36.4 C)] 97.5 F (36.4 C) (  04/14 1208) Pulse Rate:  [75-84] 78 (04/14 1444) Resp:  [16-31] 28 (04/14 1444) BP: (140-169)/(68-87) 140/76 mmHg (04/14 1444) SpO2:  [63 %-98 %] 95 % (04/14 1444) FiO2 (%):  [100 %] 100 % (04/14 1400) Weight:  [310 lb (140.615 kg)] 310 lb (140.615 kg) (04/14 1208) HEMODYNAMICS:   VENTILATOR SETTINGS: Vent Mode:  [-]  FiO2 (%):  [100 %] 100 % INTAKE /  OUTPUT: No intake or output data in the 24 hours ending 02/23/16 1559  Physical Examination:   VS: BP 140/76 mmHg  Pulse 78  Temp(Src) 97.5 F (36.4 C) (Oral)  Resp 28  Ht  (1.753 m)  Wt 310 lb (140.615 kg)  BMI 45.76 kg/m2  SpO2 95%  General Appearance: No distress  Neuro:without focal findings, mental status, speech normal,. HEENT: PERRLA, EOM intact, no ptosis, no other lesions noticed;  Pulmonary: normal breath sounds., decreased air entry bilaterally.  CardiovascularNormal S1,S2.  No m/r/g.    Abdomen: Benign, Soft, non-tender, No masses. Renal:  No costovertebral tenderness  GU:  Not performed at this time. Endoc: No evident thyromegaly, no signs of acromegaly. Skin:   warm, no rashes, no ecchymosis  Extremities: normal, no cyanosis, clubbing, no edema, warm with normal capillary refill.    LABS: Reviewed   LABORATORY PANEL:   CBC  Recent Labs Lab 02/23/16 1220  WBC 11.8*  HGB 14.2  HCT 45.4  PLT 214    Chemistries   Recent Labs Lab 02/23/16 1220  NA 140  K 3.4*  CL 98*  CO2 34*  GLUCOSE 116*  BUN 21*  CREATININE 1.31*  CALCIUM 8.7*  AST 29  ALT 18  ALKPHOS 77  BILITOT 1.1    No results for input(s): GLUCAP in the last 168 hours.  Recent Labs Lab 02/23/16 1501  PHART 7.24*  PCO2ART 92*  PO2ART 104    Recent Labs Lab 02/23/16 1220  AST 29  ALT 18  ALKPHOS 77  BILITOT 1.1  ALBUMIN 3.9    Cardiac Enzymes  Recent Labs Lab 02/23/16 1220  TROPONINI 0.03    RADIOLOGY:  Ct Angio Chest Pe W/cm &/or Wo Cm  02/23/2016  CLINICAL DATA:  Shortness of breath for 4 days and left-sided chest pain, initial encounter EXAM: CT ANGIOGRAPHY CHEST WITH CONTRAST TECHNIQUE: Multidetector CT imaging of the chest was performed using the standard protocol during bolus administration of intravenous contrast. Multiplanar CT image reconstructions and MIPs were obtained to evaluate the vascular anatomy. CONTRAST:  100 mL Isovue 370. COMPARISON:   None. FINDINGS: Lungs are well aerated bilaterally. Patchy multifocal infiltrates are noted throughout both lungs some of the infiltrates have a tree-in-bud appearance consistent with atypical infection. No focal mass lesion is noted. Small left pleural effusion is seen. This is not amenable to percutaneous drainage. The thoracic inlet is within normal limits. The thoracic aorta and its branches are unremarkable. The pulmonary artery demonstrates a normal branching pattern without definitive filling defect to suggest pulmonary embolism. The prominent mediastinum is related to some degree of mediastinal lipomatosis. There is a 17 mm short axis lymph node identified adjacent to the esophagus and trachea on the right best seen on image number 55 of series 5. No other significant mediastinal lymph nodes are seen. Some small hilar lymph nodes are noted likely of reactive nature. The visualized upper abdomen shows evidence of cholelithiasis without complicating factors. The osseous structures show degenerative change of the thoracic spine. Review of the MIP images confirms the above findings. IMPRESSION:  No evidence of pulmonary emboli. No evidence of aortic dissection or aneurysmal dilatation. Multifocal infiltrates bilaterally with some tree-in-bud appearance. This may be related to an atypical pneumonia such as MAI. Some mild lymph nodes are noted within the right hilar region and mediastinum on the right likely of reactive nature. Electronically Signed   By: Alcide Clever M.D.   On: 02/23/2016 14:45   Dg Chest Portable 1 View  02/23/2016  CLINICAL DATA:  Shortness of breath for the past 4 days, severe. Left breast pain. Obesity. EXAM: PORTABLE CHEST 1 VIEW COMPARISON:  None. FINDINGS: Enlarged cardiac silhouette. Vascular tortuosity with prominent superior mediastinal density on the right. Right middle lobe atelectasis. Loculated pleural fluid or prominent epicardial fat pad at the left lateral lung base.  IMPRESSION: 1. Prominent superior mediastinum on the right. This could be due to vascular tortuosity or a mass. Follow-up PA and lateral chest radiographs recommended when possible. 2. Right middle lobe atelectasis. Underlying pneumonia cannot be excluded. 3. Loculated pleural fluid or prominent epicardial fat pad at the left lateral lung base. 4. Cardiomegaly. Electronically Signed   By: Beckie Salts M.D.   On: 02/23/2016 12:57       --Wells Guiles, MD.  Board Certified in Internal Medicine, Pulmonary Medicine, Critical Care Medicine, and Sleep Medicine.  ICU Pager [phone] [facility] Office Number: [phone]  Santiago Glad, M.D.  Stephanie Acre, M.D.  Billy Fischer, M.D   02/23/2016, 3:59 PM    Critical Care Attestation.  I have personally obtained a history, examined the patient, evaluated laboratory and imaging results, formulated the assessment and plan and placed orders. The Patient requires high complexity decision making for assessment and support, frequent evaluation and titration of therapies, application of advanced monitoring technologies and extensive interpretation of multiple databases. The patient has critical illness that could lead imminently to failure of 1 or more organ systems and requires the highest level of physician preparedness to intervene.  Critical Care Time devoted to patient care services described in this note is 35 minutes and is exclusive of time spent in procedures.

## 2016-02-24 ENCOUNTER — Inpatient Hospital Stay (HOSPITAL_COMMUNITY): Payer: No Typology Code available for payment source

## 2016-02-24 DIAGNOSIS — B372 Candidiasis of skin and nail: Secondary | ICD-10-CM

## 2016-02-24 DIAGNOSIS — E669 Obesity, unspecified: Secondary | ICD-10-CM

## 2016-02-24 DIAGNOSIS — M109 Gout, unspecified: Secondary | ICD-10-CM | POA: Insufficient documentation

## 2016-02-24 DIAGNOSIS — E876 Hypokalemia: Secondary | ICD-10-CM

## 2016-02-24 DIAGNOSIS — J9602 Acute respiratory failure with hypercapnia: Secondary | ICD-10-CM

## 2016-02-24 DIAGNOSIS — N183 Chronic kidney disease, stage 3 unspecified: Secondary | ICD-10-CM | POA: Insufficient documentation

## 2016-02-24 DIAGNOSIS — I10 Essential (primary) hypertension: Secondary | ICD-10-CM

## 2016-02-24 LAB — CBC WITH DIFFERENTIAL/PLATELET
BASOS ABS: 0 10*3/uL (ref 0.0–0.1)
Basophils Relative: 0 %
EOS PCT: 1 %
Eosinophils Absolute: 0.1 10*3/uL (ref 0.0–0.7)
HCT: 39.1 % (ref 39.0–52.0)
Hemoglobin: 11.3 g/dL — ABNORMAL LOW (ref 13.0–17.0)
LYMPHS PCT: 11 %
Lymphs Abs: 0.9 10*3/uL (ref 0.7–4.0)
MCH: 26 pg (ref 26.0–34.0)
MCHC: 28.9 g/dL — AB (ref 30.0–36.0)
MCV: 89.9 fL (ref 78.0–100.0)
MONO ABS: 0.8 10*3/uL (ref 0.1–1.0)
MONOS PCT: 9 %
Neutro Abs: 6.9 10*3/uL (ref 1.7–7.7)
Neutrophils Relative %: 79 %
PLATELETS: 168 10*3/uL (ref 150–400)
RBC: 4.35 MIL/uL (ref 4.22–5.81)
RDW: 17.5 % — AB (ref 11.5–15.5)
WBC: 8.7 10*3/uL (ref 4.0–10.5)

## 2016-02-24 LAB — BASIC METABOLIC PANEL
ANION GAP: 8 (ref 5–15)
BUN: 15 mg/dL (ref 6–20)
CALCIUM: 7.6 mg/dL — AB (ref 8.9–10.3)
CO2: 32 mmol/L (ref 22–32)
CREATININE: 1.14 mg/dL (ref 0.61–1.24)
Chloride: 106 mmol/L (ref 101–111)
GFR calc Af Amer: >60 mL/min (ref >60)
GLUCOSE: 81 mg/dL (ref 65–99)
Potassium: 3.3 mmol/L — ABNORMAL LOW (ref 3.5–5.1)
Sodium: 146 mmol/L — ABNORMAL HIGH (ref 135–145)

## 2016-02-24 LAB — GLUCOSE, CAPILLARY
GLUCOSE-CAPILLARY: 82 mg/dL (ref 65–99)
GLUCOSE-CAPILLARY: 91 mg/dL (ref 65–99)
Glucose-Capillary: 78 mg/dL (ref 65–99)
Glucose-Capillary: 106 mg/dL — ABNORMAL HIGH (ref 65–99)
Glucose-Capillary: 100 mg/dL — ABNORMAL HIGH (ref 65–99)
Glucose-Capillary: 98 mg/dL (ref 65–99)

## 2016-02-24 LAB — POCT I-STAT 3, ART BLOOD GAS (G3+)
Acid-Base Excess: 3 mmol/L — ABNORMAL HIGH (ref 0.0–2.0)
Bicarbonate: 30.5 mEq/L — ABNORMAL HIGH (ref 20.0–24.0)
O2 SAT: 96 %
PCO2 ART: 56.3 mmHg — AB (ref 35.0–45.0)
PH ART: 7.341 — AB (ref 7.350–7.450)
PO2 ART: 86 mmHg (ref 80.0–100.0)
Patient temperature: 98.5
TCO2: 32 mmol/L (ref 0–100)

## 2016-02-24 LAB — RAPID URINE DRUG SCREEN, HOSP PERFORMED
Amphetamines: NOT DETECTED
BARBITURATES: NOT DETECTED
Benzodiazepines: POSITIVE — AB
COCAINE: NOT DETECTED
Opiates: NOT DETECTED
Tetrahydrocannabinol: NOT DETECTED

## 2016-02-24 LAB — MRSA PCR SCREENING: MRSA BY PCR: NEGATIVE

## 2016-02-24 LAB — STREP PNEUMONIAE URINARY ANTIGEN: STREP PNEUMO URINARY ANTIGEN: NEGATIVE

## 2016-02-24 MED ORDER — DEXTROSE 5 % IV SOLN: 500.0000 mg | INTRAVENOUS | Status: DC

## 2016-02-24 MED ORDER — ANTISEPTIC ORAL RINSE SOLUTION (CORINZ)
7.0000 mL | Freq: Four times a day (QID) | OROMUCOSAL | Status: DC
  Administered 2016-02-24 – 2016-02-29 (×19): 7 mL via OROMUCOSAL

## 2016-02-24 MED ORDER — VANCOMYCIN HCL 10 G IV SOLR: 1750.0000 mg | INTRAVENOUS | Status: DC

## 2016-02-24 MED ORDER — CHLORHEXIDINE GLUCONATE 0.12% ORAL RINSE (MEDLINE KIT)
15.0000 mL | Freq: Two times a day (BID) | OROMUCOSAL | Status: DC
  Administered 2016-02-24 – 2016-02-29 (×11): 15 mL via OROMUCOSAL

## 2016-02-24 MED ORDER — FENTANYL CITRATE (PF) 100 MCG/2ML IJ SOLN
50.0000 ug | Freq: Once | INTRAMUSCULAR | Status: AC
  Administered 2016-02-24: 50 ug via INTRAVENOUS

## 2016-02-24 MED ORDER — FUROSEMIDE 10 MG/ML IJ SOLN
40.0000 mg | Freq: Once | INTRAMUSCULAR | Status: AC
  Administered 2016-02-24: 40 mg via INTRAVENOUS
  Filled 2016-02-24: qty 4

## 2016-02-24 MED ORDER — SODIUM CHLORIDE 0.9% FLUSH: 3.0000 mL | INTRAVENOUS | Status: DC | PRN

## 2016-02-24 MED ORDER — MIDAZOLAM HCL 2 MG/2ML IJ SOLN
2.0000 mg | INTRAMUSCULAR | Status: DC | PRN
  Administered 2016-02-24 – 2016-02-27 (×4): 2 mg via INTRAVENOUS
  Filled 2016-02-24 (×4): qty 2

## 2016-02-24 MED ORDER — SODIUM CHLORIDE 0.9% FLUSH
3.0000 mL | Freq: Two times a day (BID) | INTRAVENOUS | Status: DC
  Administered 2016-02-24 (×2): via INTRAVENOUS
  Administered 2016-02-24 – 2016-02-27 (×5): 3 mL via INTRAVENOUS
  Administered 2016-02-28: 10 mL via INTRAVENOUS
  Administered 2016-02-29 (×2): 3 mL via INTRAVENOUS

## 2016-02-24 MED ORDER — VANCOMYCIN HCL 10 G IV SOLR
2500.0000 mg | Freq: Once | INTRAVENOUS | Status: DC
  Filled 2016-02-24: qty 2500

## 2016-02-24 MED ORDER — MIDAZOLAM HCL 2 MG/2ML IJ SOLN
2.0000 mg | INTRAMUSCULAR | Status: DC | PRN
  Administered 2016-02-26: 2 mg via INTRAVENOUS
  Filled 2016-02-24: qty 2

## 2016-02-24 MED ORDER — PANTOPRAZOLE SODIUM 40 MG IV SOLR
40.0000 mg | Freq: Every day | INTRAVENOUS | Status: DC
  Administered 2016-02-24 – 2016-02-26 (×4): 40 mg via INTRAVENOUS
  Filled 2016-02-24 (×4): qty 40

## 2016-02-24 MED ORDER — SODIUM CHLORIDE 0.9 % IV SOLN
250.0000 mL | INTRAVENOUS | Status: DC | PRN
  Administered 2016-02-24: 250 mL via INTRAVENOUS

## 2016-02-24 MED ORDER — ACETAMINOPHEN 650 MG RE SUPP: 650.0000 mg | Freq: Four times a day (QID) | RECTAL | Status: DC | PRN

## 2016-02-24 MED ORDER — DEXTROSE 5 % IV SOLN
2.0000 g | INTRAVENOUS | Status: AC
  Administered 2016-02-24 – 2016-02-29 (×6): 2 g via INTRAVENOUS
  Filled 2016-02-24 (×6): qty 2

## 2016-02-24 MED ORDER — ENOXAPARIN SODIUM 80 MG/0.8ML ~~LOC~~ SOLN
80.0000 mg | SUBCUTANEOUS | Status: DC
Start: 2016-02-24 — End: 2016-03-01
  Administered 2016-02-24 – 2016-03-01 (×7): 80 mg via SUBCUTANEOUS
  Filled 2016-02-24 (×7): qty 0.8

## 2016-02-24 MED ORDER — POTASSIUM CHLORIDE 20 MEQ PO PACK: 40.0000 meq | PACK | Freq: Once | ORAL | Status: DC

## 2016-02-24 MED ORDER — VITAL HIGH PROTEIN PO LIQD
1000.0000 mL | ORAL | Status: DC
  Administered 2016-02-24 – 2016-02-26 (×3): 1000 mL
  Administered 2016-02-27: 16:00:00
  Administered 2016-02-27 – 2016-02-28 (×3): 1000 mL

## 2016-02-24 MED ORDER — POTASSIUM CHLORIDE 20 MEQ PO PACK
40.0000 meq | PACK | ORAL | Status: AC
  Administered 2016-02-24 (×2): 40 meq
  Filled 2016-02-24 (×2): qty 2

## 2016-02-24 MED ORDER — SODIUM CHLORIDE 0.9% FLUSH
10.0000 mL | Freq: Two times a day (BID) | INTRAVENOUS | Status: DC
  Administered 2016-02-24 – 2016-02-28 (×9): 10 mL
  Administered 2016-02-29: 30 mL

## 2016-02-24 MED ORDER — ACETAMINOPHEN 325 MG PO TABS: 650.0000 mg | ORAL_TABLET | Freq: Four times a day (QID) | ORAL | Status: DC | PRN

## 2016-02-24 MED ORDER — HYDROXYZINE HCL 25 MG PO TABS
25.0000 mg | ORAL_TABLET | Freq: Three times a day (TID) | ORAL | Status: DC | PRN
  Administered 2016-02-24 – 2016-02-29 (×2): 25 mg via ORAL
  Filled 2016-02-24 (×3): qty 1

## 2016-02-24 MED ORDER — SODIUM CHLORIDE 0.9% FLUSH
10.0000 mL | INTRAVENOUS | Status: DC | PRN
  Administered 2016-02-24: 10 mL
  Filled 2016-02-24: qty 40

## 2016-02-24 MED ORDER — FENTANYL BOLUS VIA INFUSION
50.0000 ug | INTRAVENOUS | Status: DC | PRN
  Filled 2016-02-24: qty 50

## 2016-02-24 MED ORDER — DEXTROSE 5 % IV SOLN
500.0000 mg | INTRAVENOUS | Status: DC
  Administered 2016-02-24 – 2016-02-26 (×3): 500 mg via INTRAVENOUS
  Filled 2016-02-24 (×5): qty 500

## 2016-02-24 MED ORDER — SODIUM CHLORIDE 0.9 % IV SOLN
25.0000 ug/h | INTRAVENOUS | Status: DC
  Administered 2016-02-24 – 2016-02-25 (×3): 150 ug/h via INTRAVENOUS
  Administered 2016-02-25 – 2016-02-26 (×2): 200 ug/h via INTRAVENOUS
  Filled 2016-02-24 (×3): qty 50

## 2016-02-24 MED ORDER — PRO-STAT SUGAR FREE PO LIQD
60.0000 mL | Freq: Three times a day (TID) | ORAL | Status: DC
  Administered 2016-02-24 – 2016-02-29 (×15): 60 mL
  Filled 2016-02-24 (×16): qty 60

## 2016-02-24 MED ORDER — NYSTATIN 100000 UNIT/GM EX POWD
Freq: Three times a day (TID) | CUTANEOUS | Status: DC
Start: 2016-02-24 — End: 2016-03-07
  Administered 2016-02-24 (×3): via TOPICAL
  Administered 2016-02-25 (×3): 1 g via TOPICAL
  Administered 2016-02-26: 16:00:00 via TOPICAL
  Administered 2016-02-26: 1 via TOPICAL
  Administered 2016-02-26: 21:00:00 via TOPICAL
  Administered 2016-02-27: 1 g via TOPICAL
  Administered 2016-02-27 – 2016-03-07 (×20): via TOPICAL
  Filled 2016-02-24: qty 15

## 2016-02-24 NOTE — Progress Notes (Addendum)
Initial Nutrition Assessment  DOCUMENTATION CODES:   Morbid obesity  INTERVENTION:  - If pt to remain intubated >/= 24 hours, recommend Vital High Protein @ 45 mL/hr with 60 mL Prostat TID to provide 1680 kcal, 184.5 grams protein, and 903 mL free water. - RD will continue to monitor for needs related to medical course  NUTRITION DIAGNOSIS:   Inadequate oral intake related to inability to eat as evidenced by NPO status.  GOAL:   Provide needs based on ASPEN/SCCM guidelines  MONITOR:   Vent status, Weight trends, Labs, I & O's  REASON FOR ASSESSMENT:   Ventilator  ASSESSMENT:   65 yo with history of HTN, HLD, morbid obesity, gout who came in with 4 day history of shortness of breath, worse with exertion., subjective fevers, and intermittent left sided chest soreness. His O2 sats was 63% on 3L [place]. Was on nonrebreather, and CXR shows right middle lobe atelectasis- can not exclude pneumonia. Was started on IV fluids, ceftriaxone, and azithromycin. CT of chest showed multifocal pneumonia. He was put on bipap and by the evening, pt had worsening acidosis and hypercarbia on his repeat ABG. Pt was then intubated.   Pt seen for new vent. BMI indicates morbid obesity. No family/visitors at bedside to provide information from PTA.   Patient is currently intubated on ventilator support with OGT in place. MV: 11 L/min Temp (24hrs), Avg:98.4 F (36.9 C), Min:97.5 F (36.4 C), Max:99.7 F (37.6 C) Propofol: none  Nutrition needs based on recommendations per ASPEN for morbidly obese, critically ill pt. Noted that weight 4/14 was 310 lbs and that weight earlier today was 375 lbs with current weight being recorded as 360 lbs; used weight of 360 lbs to calculate needs. Will need to monitor weight trends to determine if adjustment needs to be made in calculating needs based on weight. Physical exam shows mild edema and no muscle or fat wasting. No previous weight hx in chart before  02/23/16.  TF recommendations outlined above. Medications reviewed. Labs reviewed; Na: 146 mmol/L, K: 3.3 mmol/L, Ca: 7.6 mg/dL. Drip: Fentanyl @ 100 mcg/hr.  ADDENDUM: New consult for RD to initiate and manage TF; will order TF as recommended above.    Diet Order:  Diet NPO time specified  Skin:  Reviewed, no issues  Last BM:  PTA  Height:   Ht Readings from Last 1 Encounters:  02/24/16 5\' 9"  (1.753 m)    Weight:   Wt Readings from Last 1 Encounters:  02/24/16 360 lb (163.295 kg)    Ideal Body Weight:  72.73 kg (kg)  BMI:  Body mass index is 53.14 kg/(m^2).  Estimated Nutritional Needs:   Kcal:  1600-1818 (22-25 kcal/kg IBW)  Protein:  182 grams (2.5 grams/kg IBW)  Fluid:  1.6-1.8 L/day  EDUCATION NEEDS:   No education needs identified at this time     Trenton GammonJessica Foday Cone, RD, LDN Inpatient Clinical Dietitian Pager # [phone] After hours/weekend pager # [phone]

## 2016-02-24 NOTE — H&P (Signed)
PULMONARY / CRITICAL CARE MEDICINE   Name: Dylan Kennedy MRN: [id number] DOB: 10/05/1951    ADMISSION DATE:  02/23/2016  REFERRING MD:  [place]  CHIEF COMPLAINT:  Respiratory distress   HISTORY OF PRESENT ILLNESS:    64 yo with history of HTN, HLD, morbid obesity, gout who came in with 4 day history of shortness of breath, worse with exertion., subjective fevers, and intermittent left sided chest soreness. His O2 sats was 63% on 3L [place]. Was on nonrebreather, and CXR shows right middle lobe atelectasis- can not exclude pneumonia. Was started on IV fluids, ceftriaxone, and azithromycin. CT of chest showed multifocal pneumonia.  He was put on bipap and by the evening, pt had worsening acidosis and hypercarbia on his repeat ABG. Pt was then intubated.  Per Dr Nemiah Commander H&P, history there was obtained by his brother-in-law, who said he was able to ambulate with thehelp of a cane. Pt started having chills and resp symptoms for 4 days.     PAST MEDICAL HISTORY :  He  has a past medical history of Hypertension; Gout; GERD (gastroesophageal reflux disease); and Morbid obesity (HCC).  PAST SURGICAL HISTORY: He  has past surgical history that includes Tonsillectomy; Shoulder surgery; and Colonoscopy.  Allergies  Allergen Reactions  . Bee Venom Hives and Itching    No current facility-administered medications on file prior to encounter.   Current Outpatient Prescriptions on File Prior to Encounter  Medication Sig  . acetaminophen (TYLENOL) 500 MG tablet Take 1,000 mg by mouth every 6 (six) hours as needed for mild pain or headache.  . allopurinol (ZYLOPRIM) 300 MG tablet Take 300 mg by mouth every evening.  . cholecalciferol (VITAMIN D) 1000 units tablet Take 2,000 Units by mouth every evening.  Marland Kitchen lisinopril (PRINIVIL,ZESTRIL) 10 MG tablet Take 10 mg by mouth every evening.  Marland Kitchen lisinopril-hydrochlorothiazide (PRINZIDE,ZESTORETIC) 20-12.5 MG tablet Take 1 tablet by mouth every evening.  .  pantoprazole (PROTONIX) 40 MG tablet Take 40 mg by mouth every evening.    FAMILY HISTORY:  His has no family status information on file.   SOCIAL HISTORY: He  reports that he has never smoked. He does not have any smokeless tobacco history on file. He reports that he does not drink alcohol or use illicit drugs.  REVIEW OF SYSTEMS:   Per HPI   VITAL SIGNS: BP 93/59 mmHg  Pulse 58  Temp(Src) 98.5 F (36.9 C) (Oral)  Resp 24  Ht 5\' 9"  (1.753 m)  Wt 375 lb (170.099 kg)  BMI 55.35 kg/m2  SpO2 99%  HEMODYNAMICS:    VENTILATOR SETTINGS: Vent Mode:  [-] PRVC FiO2 (%):  [100 %] 100 % Set Rate:  [22 bmp-24 bmp] 24 bmp Vt Set:  [450 mL-500 mL] 450 mL PEEP:  [5 cmH20] 5 cmH20 Plateau Pressure:  [28 cmH20] 28 cmH20  INTAKE / OUTPUT:    PHYSICAL EXAMINATION: General:  Somnolent, intubated, opens eyes to commands Neuro:  somnolent Cardiovascular:  Rrr, no mrg Lungs:  CTAB no wheezing  Abdomen:  +BS Skin: rash in intertrigo area   LABS:  BMET  Recent Labs Lab 02/23/16 1220  NA 140  K 3.4*  CL 98*  CO2 34*  BUN 21*  CREATININE 1.31*  GLUCOSE 116*    Electrolytes  Recent Labs Lab 02/23/16 1220  CALCIUM 8.7*    CBC  Recent Labs Lab 02/23/16 1220  WBC 11.8*  HGB 14.2  HCT 45.4  PLT 214    Coag's No results for input(s):  APTT, INR in the last 168 hours.  Sepsis Markers  Recent Labs Lab 02/23/16 1339  LATICACIDVEN 1.4    ABG  Recent Labs Lab 02/23/16 1740 02/23/16 1950 02/24/16 0026  PHART 7.18* 7.42 7.341*  PCO2ART 109* 50* 56.3*  PO2ART 72* 127* 86.0    Liver Enzymes  Recent Labs Lab 02/23/16 1220  AST 29  ALT 18  ALKPHOS 77  BILITOT 1.1  ALBUMIN 3.9    Cardiac Enzymes  Recent Labs Lab 02/23/16 1220  TROPONINI 0.03    Glucose  Recent Labs Lab 02/24/16 0027  GLUCAP 82    Imaging Ct Angio Chest Pe W/cm &/or Wo Cm  02/23/2016  CLINICAL DATA:  Shortness of breath for 4 days and left-sided chest pain,  initial encounter EXAM: CT ANGIOGRAPHY CHEST WITH CONTRAST TECHNIQUE: Multidetector CT imaging of the chest was performed using the standard protocol during bolus administration of intravenous contrast. Multiplanar CT image reconstructions and MIPs were obtained to evaluate the vascular anatomy. CONTRAST:  100 mL Isovue 370. COMPARISON:  None. FINDINGS: Lungs are well aerated bilaterally. Patchy multifocal infiltrates are noted throughout both lungs some of the infiltrates have a tree-in-bud appearance consistent with atypical infection. No focal mass lesion is noted. Small left pleural effusion is seen. This is not amenable to percutaneous drainage. The thoracic inlet is within normal limits. The thoracic aorta and its branches are unremarkable. The pulmonary artery demonstrates a normal branching pattern without definitive filling defect to suggest pulmonary embolism. The prominent mediastinum is related to some degree of mediastinal lipomatosis. There is a 17 mm short axis lymph node identified adjacent to the esophagus and trachea on the right best seen on image number 55 of series 5. No other significant mediastinal lymph nodes are seen. Some small hilar lymph nodes are noted likely of reactive nature. The visualized upper abdomen shows evidence of cholelithiasis without complicating factors. The osseous structures show degenerative change of the thoracic spine. Review of the MIP images confirms the above findings. IMPRESSION: No evidence of pulmonary emboli. No evidence of aortic dissection or aneurysmal dilatation. Multifocal infiltrates bilaterally with some tree-in-bud appearance. This may be related to an atypical pneumonia such as MAI. Some mild lymph nodes are noted within the right hilar region and mediastinum on the right likely of reactive nature. Electronically Signed   By: Alcide CleverMark  Lukens M.D.   On: 02/23/2016 14:45   Dg Chest Portable 1 View  02/23/2016  CLINICAL DATA:  Line placement. EXAM:  PORTABLE CHEST 1 VIEW COMPARISON:  02/23/2016. FINDINGS: Endotracheal tube terminates 2.8 cm above the carina. Nasogastric tube is followed into the stomach. Right IJ central line tip projects over the SVC. Heart is enlarged. Patchy bilateral airspace opacification, worst at the lung bases. Moderate left pleural effusion. No pneumothorax. IMPRESSION: 1. Right IJ central line tip projects over the SVC. No pneumothorax. 2. Congestive heart failure versus pneumonia. Electronically Signed   By: Leanna BattlesMelinda  Blietz M.D.   On: 02/23/2016 21:49   Dg Chest Portable 1 View  02/23/2016  CLINICAL DATA:  Shortness of breath for the past 4 days. Left breast pain. EXAM: PORTABLE CHEST 1 VIEW COMPARISON:  Earlier today. FINDINGS: The endotracheal tube is in satisfactory position and has been advanced. Nasogastric tube extending into the stomach. Mildly increased bilateral airspace opacity. The heart borders are obscured by the airspace opacity, with no gross change in enlargement of the cardiac silhouette. Mildly increased pleural fluid on the left. Unremarkable bones. IMPRESSION: 1. Endotracheal tube in satisfactory position.  2. Mild increased bilateral alveolar edema or pneumonia. 3. Mildly increased left pleural fluid. Electronically Signed   By: Beckie Salts M.D.   On: 02/23/2016 19:19   Dg Chest Portable 1 View  02/23/2016  CLINICAL DATA:  Patient status post intubation. EXAM: PORTABLE CHEST 1 VIEW COMPARISON:  Chest CT 02/23/2016 FINDINGS: ET tube terminates within the proximal trachea, near the thoracic inlet. Enteric tube courses inferior to the diaphragm. Low lung volumes. Cardiomegaly. Extensive bilateral pulmonary consolidation. Small left pleural effusion. No pneumothorax. IMPRESSION: ETT terminates above the thoracic inlet. Recommend advancement and repeat radiograph. Cardiomegaly. Multi focal pulmonary opacities may represent combination of edema and or infection. These results will be called to the ordering  clinician or representative by the Radiologist Assistant, and communication documented in the PACS or zVision Dashboard. Electronically Signed   By: Annia Belt M.D.   On: 02/23/2016 18:38   Dg Chest Portable 1 View  02/23/2016  CLINICAL DATA:  Shortness of breath for the past 4 days, severe. Left breast pain. Obesity. EXAM: PORTABLE CHEST 1 VIEW COMPARISON:  None. FINDINGS: Enlarged cardiac silhouette. Vascular tortuosity with prominent superior mediastinal density on the right. Right middle lobe atelectasis. Loculated pleural fluid or prominent epicardial fat pad at the left lateral lung base. IMPRESSION: 1. Prominent superior mediastinum on the right. This could be due to vascular tortuosity or a mass. Follow-up PA and lateral chest radiographs recommended when possible. 2. Right middle lobe atelectasis. Underlying pneumonia cannot be excluded. 3. Loculated pleural fluid or prominent epicardial fat pad at the left lateral lung base. 4. Cardiomegaly. Electronically Signed   By: Beckie Salts M.D.   On: 02/23/2016 12:57     STUDIES:  CXR CT angio  CULTURES: Bcx 4/14> Ucx 4/14>  ANTIBIOTICS: Vanc 4/14>> 4/14 Zosyn 4/14 > 4/14  Ceftriaxone 4/14 > Azithro 4/14>   SIGNIFICANT EVENTS:   LINES/TUBES: ETT 4/14 >  DISCUSSION: 65 yo man with history of HTN, HLD, morbid obesity, gout and COPD.   ASSESSMENT / PLAN:  PULMONARY A: Acute hypoxic and hypercarbic respiratory failure secondary to multifocal pneumonia  And possible OSA  ABG showing respiratory acidosis - pH of 7.24, pCO2 of 92   P:   -mechanical ventilation.  RT to increase MV based on most recent ABG - CXR with ETT in appropriate position - PPI - Oral Care - VAP prevention bundle - daily sedation vacation and breathing trials.  -versed q2 PRN -ceftriaxone and azithromycin  -given one time lasix -see ID  CARDIOVASCULAR A:  HTN- currently pressures low-normal  P:  -given IV lasix one time  -holding anti-HTN,  resume if needed  RENAL A:   CKD 3 Cr at baseline Gout   P:      GASTROINTESTINAL A:   GERD  P:   Protonix  HEMATOLOGIC A:   No active issues P:    INFECTIOUS A:   CT showing multifocal pneumonia  P:   -ceftriaxone  -azithro -f/u pan cultures -f/u legionella  -f/u strep pneumo urinary antigen  ENDOCRINE A:   No active issue P:   -monitor CBG q4  Rheumatologic:: Chronic Gout  P -resume allopurinol when taking PO   NEUROLOGIC A:   Pt intubated/sedated P:   -versed prn  RASS goal: 0   Signed Deneise Lever,  PGY1 Internal Medicine  I have seen and evaluated the patient.  I have discussed the plan with the resident and agree with the above.   I have made changes to the  note content as I deemed fit.    Total critical care time: 30 min  Critical care time was exclusive of separately billable procedures and treating other patients.  Critical care was necessary to treat or prevent imminent or life-threatening deterioration.  Critical care was time spent personally by me on the following activities: development of treatment plan with patient and/or surrogate as well as nursing, discussions with consultants, evaluation of patient's response to treatment, examination of patient, obtaining history from patient or surrogate, ordering and performing treatments and interventions, ordering and review of laboratory studies, ordering and review of radiographic studies, pulse oximetry and re-evaluation of patient's condition.   Galvin Proffer, DO., MS [facility]     02/24/2016, 12:57 AM

## 2016-02-24 NOTE — Progress Notes (Signed)
Pharmacy Antibiotic Note  Dylan BottomsJames Kennedy is a 65 y.o. male admitted on 02/23/2016 with pneumonia.  Pharmacy has been consulted for Vancomycin and Ce[facility] dosing Tx [facility]. WBC 11.8. Mild bump in Scr. Normalized CrCL is around 60 mL/min.   Plan: -Vancomycin 2500 mg IV x 1 load, then give 1750 mg IV q24h -Ceftriaxone 2g IV q24h -Azithro per MD -Trend WBC, temp, renal function  -Drug levels as indicated   Height: 5\' 9"  (175.3 cm) Weight: (!) 375 lb (170.099 kg) IBW/kg (Calculated) : 70.7  Temp (24hrs), Avg:97.8 F (36.6 C), Min:97.5 F (36.4 C), Max:98.5 F (36.9 C)   Recent Labs Lab 02/23/16 1220 02/23/16 1339  WBC 11.8*  --   CREATININE 1.31*  --   LATICACIDVEN  --  1.4    Estimated Creatinine Clearance: 90.2 mL/min (by C-G formula based on Cr of 1.31).    Allergies  Allergen Reactions  . Bee Venom Hives and Itching    Abran DukeLedford, Smitty 02/24/2016 1:22 AM

## 2016-02-24 NOTE — Progress Notes (Signed)
Dr. Donnamae JudeLo Verde made vent changes. FIO2 was decreased to 50% and peep was increased to 8. RTT notified.

## 2016-02-24 NOTE — Progress Notes (Signed)
Pt rhythm appears to be junctional on unit monitor. EKG obtained. Reported to Dr. Andrey CampanileWilson.

## 2016-02-24 NOTE — Progress Notes (Signed)
PULMONARY / CRITICAL CARE MEDICINE   Name: Dylan Kennedy MRN: [id number] DOB: 08-21-1951    ADMISSION DATE:  02/23/2016  REFERRING MD:  [place]  CHIEF COMPLAINT:  Respiratory distress   HISTORY OF PRESENT ILLNESS:    65 yo with history of HTN, HLD, morbid obesity, gout who came in with 4 day history of shortness of breath, worse with exertion., subjective fevers, and intermittent left sided chest soreness. His O2 sats was 63% on 3L [place]. Was on nonrebreather, and CXR shows right middle lobe atelectasis- can not exclude pneumonia. Was started on IV fluids, ceftriaxone, and azithromycin. CT of chest showed multifocal pneumonia.  He was put on bipap and by the evening, pt had worsening acidosis and hypercarbia on his repeat ABG. Pt was then intubated.  Per Dr Nemiah Commander H&P, history there was obtained by his brother-in-law, who said he was able to ambulate with thehelp of a cane. Pt started having chills and resp symptoms for 4 days.   Subjective:  Stable overnight, intubated, 0.50 + PEEP 8 No pressors.   VITAL SIGNS: BP 111/66 mmHg  Pulse 63  Temp(Src) 99.7 F (37.6 C) (Oral)  Resp 24  Ht  (1.753 m)  Wt 163.295 kg (360 lb)  BMI 53.14 kg/m2  SpO2 94%  HEMODYNAMICS:    VENTILATOR SETTINGS: Vent Mode:  [-] PRVC FiO2 (%):  [50 %-100 %] 50 % Set Rate:  [22 bmp-24 bmp] 24 bmp Vt Set:  [450 mL-500 mL] 450 mL PEEP:  [5 cmH20-8 cmH20] 8 cmH20 Plateau Pressure:  [24 cmH20-28 cmH20] 24 cmH20  INTAKE / OUTPUT: I/O last 3 completed shifts: In: 616.6 [I.V.:506.6; NG/GT:60; IV Piggyback:50] Out: 1750 [Urine:1750]  PHYSICAL EXAMINATION: General:  Somnolent, intubated,  Neuro:  Somnolent, opens eyes to commands, nodded to questions Cardiovascular:  Rrr, no mrg Lungs: decreased at based, clear anterior exam Abdomen:  +BS Skin: rash in intertriginous  area   LABS:  BMET  Recent Labs Lab 02/23/16 1220 02/24/16 0535  NA 140 146*  K 3.4* 3.3*  CL 98* 106  CO2 34* 32   BUN 21* 15  CREATININE 1.31* 1.14  GLUCOSE 116* 81    Electrolytes  Recent Labs Lab 02/23/16 1220 02/24/16 0535  CALCIUM 8.7* 7.6*    CBC  Recent Labs Lab 02/23/16 1220 02/24/16 0535  WBC 11.8* 8.7  HGB 14.2 11.3*  HCT 45.4 39.1  PLT 214 168    Coag's No results for input(s): APTT, INR in the last 168 hours.  Sepsis Markers  Recent Labs Lab 02/23/16 1339  LATICACIDVEN 1.4    ABG  Recent Labs Lab 02/23/16 1740 02/23/16 1950 02/24/16 0026  PHART 7.18* 7.42 7.341*  PCO2ART 109* 50* 56.3*  PO2ART 72* 127* 86.0    Liver Enzymes  Recent Labs Lab 02/23/16 1220  AST 29  ALT 18  ALKPHOS 77  BILITOT 1.1  ALBUMIN 3.9    Cardiac Enzymes  Recent Labs Lab 02/23/16 1220  TROPONINI 0.03    Glucose  Recent Labs Lab 02/24/16 0027 02/24/16 0352 02/24/16 0819  GLUCAP 82 78 91    Imaging Ct Angio Chest Pe W/cm &/or Wo Cm  02/23/2016  CLINICAL DATA:  Shortness of breath for 4 days and left-sided chest pain, initial encounter EXAM: CT ANGIOGRAPHY CHEST WITH CONTRAST TECHNIQUE: Multidetector CT imaging of the chest was performed using the standard protocol during bolus administration of intravenous contrast. Multiplanar CT image reconstructions and MIPs were obtained to evaluate the vascular anatomy. CONTRAST:  100 mL  Isovue 370. COMPARISON:  None. FINDINGS: Lungs are well aerated bilaterally. Patchy multifocal infiltrates are noted throughout both lungs some of the infiltrates have a tree-in-bud appearance consistent with atypical infection. No focal mass lesion is noted. Small left pleural effusion is seen. This is not amenable to percutaneous drainage. The thoracic inlet is within normal limits. The thoracic aorta and its branches are unremarkable. The pulmonary artery demonstrates a normal branching pattern without definitive filling defect to suggest pulmonary embolism. The prominent mediastinum is related to some degree of mediastinal lipomatosis.  There is a 17 mm short axis lymph node identified adjacent to the esophagus and trachea on the right best seen on image number 55 of series 5. No other significant mediastinal lymph nodes are seen. Some small hilar lymph nodes are noted likely of reactive nature. The visualized upper abdomen shows evidence of cholelithiasis without complicating factors. The osseous structures show degenerative change of the thoracic spine. Review of the MIP images confirms the above findings. IMPRESSION: No evidence of pulmonary emboli. No evidence of aortic dissection or aneurysmal dilatation. Multifocal infiltrates bilaterally with some tree-in-bud appearance. This may be related to an atypical pneumonia such as MAI. Some mild lymph nodes are noted within the right hilar region and mediastinum on the right likely of reactive nature. Electronically Signed   By: Alcide CleverMark  Lukens M.D.   On: 02/23/2016 14:45   Dg Chest Port 1 View  02/24/2016  CLINICAL DATA:  65 year old male with ventilator associated pneumonia EXAM: PORTABLE CHEST 1 VIEW COMPARISON:  Prior chest x-ray 02/23/2016 FINDINGS: The endotracheal tube is 3.1 cm above the carina. A nasogastric tube is present. The tip is not visualized but lies below the diaphragm presumably within the stomach. There is similar appearance of the chest with very low inspiratory volumes and right greater than left bibasilar opacities. No edema. Pneumothorax. Right IJ central venous catheter remains in unchanged position with the tip overlying the superior cavoatrial junction. No acute osseous abnormality. IMPRESSION: 1. Stable and satisfactory support apparatus. 2. Similar appearance of the chest with low inspiratory volumes and right greater than left airspace opacities concerning for multi focal pneumonia or atypical respiratory infection. Electronically Signed   By: Malachy MoanHeath  McCullough M.D.   On: 02/24/2016 07:14   Dg Chest Portable 1 View  02/23/2016  CLINICAL DATA:  Line placement. EXAM:  PORTABLE CHEST 1 VIEW COMPARISON:  02/23/2016. FINDINGS: Endotracheal tube terminates 2.8 cm above the carina. Nasogastric tube is followed into the stomach. Right IJ central line tip projects over the SVC. Heart is enlarged. Patchy bilateral airspace opacification, worst at the lung bases. Moderate left pleural effusion. No pneumothorax. IMPRESSION: 1. Right IJ central line tip projects over the SVC. No pneumothorax. 2. Congestive heart failure versus pneumonia. Electronically Signed   By: Leanna BattlesMelinda  Blietz M.D.   On: 02/23/2016 21:49   Dg Chest Portable 1 View  02/23/2016  CLINICAL DATA:  Shortness of breath for the past 4 days. Left breast pain. EXAM: PORTABLE CHEST 1 VIEW COMPARISON:  Earlier today. FINDINGS: The endotracheal tube is in satisfactory position and has been advanced. Nasogastric tube extending into the stomach. Mildly increased bilateral airspace opacity. The heart borders are obscured by the airspace opacity, with no gross change in enlargement of the cardiac silhouette. Mildly increased pleural fluid on the left. Unremarkable bones. IMPRESSION: 1. Endotracheal tube in satisfactory position. 2. Mild increased bilateral alveolar edema or pneumonia. 3. Mildly increased left pleural fluid. Electronically Signed   By: Zada FindersSteven  Reid M.D.  On: 02/23/2016 19:19   Dg Chest Portable 1 View  02/23/2016  CLINICAL DATA:  Patient status post intubation. EXAM: PORTABLE CHEST 1 VIEW COMPARISON:  Chest CT 02/23/2016 FINDINGS: ET tube terminates within the proximal trachea, near the thoracic inlet. Enteric tube courses inferior to the diaphragm. Low lung volumes. Cardiomegaly. Extensive bilateral pulmonary consolidation. Small left pleural effusion. No pneumothorax. IMPRESSION: ETT terminates above the thoracic inlet. Recommend advancement and repeat radiograph. Cardiomegaly. Multi focal pulmonary opacities may represent combination of edema and or infection. These results will be called to the ordering  clinician or representative by the Radiologist Assistant, and communication documented in the PACS or zVision Dashboard. Electronically Signed   By: Annia Belt M.D.   On: 02/23/2016 18:38   Dg Chest Portable 1 View  02/23/2016  CLINICAL DATA:  Shortness of breath for the past 4 days, severe. Left breast pain. Obesity. EXAM: PORTABLE CHEST 1 VIEW COMPARISON:  None. FINDINGS: Enlarged cardiac silhouette. Vascular tortuosity with prominent superior mediastinal density on the right. Right middle lobe atelectasis. Loculated pleural fluid or prominent epicardial fat pad at the left lateral lung base. IMPRESSION: 1. Prominent superior mediastinum on the right. This could be due to vascular tortuosity or a mass. Follow-up PA and lateral chest radiographs recommended when possible. 2. Right middle lobe atelectasis. Underlying pneumonia cannot be excluded. 3. Loculated pleural fluid or prominent epicardial fat pad at the left lateral lung base. 4. Cardiomegaly. Electronically Signed   By: Beckie Salts M.D.   On: 02/23/2016 12:57     STUDIES:  CXR CT angio  CULTURES: Bcx 4/14 >> Ucx 4/14 >> resp 4/15 >>  RVP 4/15 >>   ANTIBIOTICS: Vanc 4/14>> 4/14 Zosyn 4/14 > 4/14  Ceftriaxone 4/14 > Azithro 4/14>  SIGNIFICANT EVENTS:  LINES/TUBES: ETT 4/14 >  DISCUSSION: 65 yo man with history of HTN, HLD, morbid obesity, gout and COPD. Admitted with resp failure, suspected CAP  ASSESSMENT / PLAN:  PULMONARY A: Acute hypoxic and hypercarbic respiratory failure secondary to multifocal pneumonia  And possible OSA  P:   - PRVC 8cc/kg, PEEP 8. Wean fio2 and then PEEP as tolerated.  - Oral Care - VAP prevention bundle - daily sedation vacation and breathing trials.  -Abx as below, ceftriaxone and azithromycin  -given one time lasix; consider further empiric diuresis as he can tolerate  CARDIOVASCULAR A:  HTN- currently pressures low-normal Possible cardiogenic pulm edema P:  -check TTE to eval LV  fxn -given IV lasix one time 4/14 -holding anti-HTN, resume if needed  RENAL A:   CKD 3 Cr at baseline Gout  P:    Follow BMP  GASTROINTESTINAL A:   GERD P:   Protonix Nutrition consult for TF  HEMATOLOGIC A:   No active issues P:  Follow CBC  INFECTIOUS A:   Probable CAP w B infiltrates P:   -ceftriaxone  -azithro -f/u pan cultures -f/u legionella  -f/u strep pneumo urinary antigen  ENDOCRINE A:   No active issue P:   -monitor CBG q4  Rheumatologic:: Chronic Gout P -resume allopurinol when taking PO   NEUROLOGIC A:   Pt intubated/sedated P:   -fentanyl gtt -versed prn  RASS goal: -1  Independent CC time 35 minutes  Levy Pupa, MD, PhD 02/24/2016, 11:02 AM [facility] [phone] or if no answer [phone]

## 2016-02-25 ENCOUNTER — Inpatient Hospital Stay (HOSPITAL_COMMUNITY): Payer: No Typology Code available for payment source

## 2016-02-25 DIAGNOSIS — E669 Obesity, unspecified: Secondary | ICD-10-CM

## 2016-02-25 LAB — BASIC METABOLIC PANEL
Anion gap: 10 (ref 5–15)
BUN: 25 mg/dL — AB (ref 6–20)
CHLORIDE: 105 mmol/L (ref 101–111)
CO2: 31 mmol/L (ref 22–32)
CREATININE: 1.31 mg/dL — AB (ref 0.61–1.24)
Calcium: 8.1 mg/dL — ABNORMAL LOW (ref 8.9–10.3)
GFR calc Af Amer: >60 mL/min (ref >60)
GFR calc non Af Amer: 56 mL/min — ABNORMAL LOW (ref >60)
Glucose, Bld: 100 mg/dL — ABNORMAL HIGH (ref 65–99)
Potassium: 3.4 mmol/L — ABNORMAL LOW (ref 3.5–5.1)
SODIUM: 146 mmol/L — AB (ref 135–145)

## 2016-02-25 LAB — CBC
HEMATOCRIT: 39.5 % (ref 39.0–52.0)
HEMOGLOBIN: 11.7 g/dL — AB (ref 13.0–17.0)
MCH: 26.6 pg (ref 26.0–34.0)
MCHC: 29.6 g/dL — AB (ref 30.0–36.0)
MCV: 89.8 fL (ref 78.0–100.0)
Platelets: 171 10*3/uL (ref 150–400)
RBC: 4.4 MIL/uL (ref 4.22–5.81)
RDW: 18 % — ABNORMAL HIGH (ref 11.5–15.5)
WBC: 8.4 10*3/uL (ref 4.0–10.5)

## 2016-02-25 LAB — GLUCOSE, CAPILLARY
GLUCOSE-CAPILLARY: 102 mg/dL — AB (ref 65–99)
Glucose-Capillary: 105 mg/dL — ABNORMAL HIGH (ref 65–99)
Glucose-Capillary: 91 mg/dL (ref 65–99)
Glucose-Capillary: 91 mg/dL (ref 65–99)

## 2016-02-25 MED ORDER — POTASSIUM CHLORIDE 20 MEQ/15ML (10%) PO SOLN
20.0000 meq | ORAL | Status: AC
  Administered 2016-02-25 (×2): 20 meq
  Filled 2016-02-25 (×3): qty 15

## 2016-02-25 NOTE — Progress Notes (Signed)
PULMONARY / CRITICAL CARE MEDICINE   Name: Dylan Kennedy MRN: [id number] DOB: 1951/08/24    ADMISSION DATE:  02/23/2016  REFERRING MD:  [place]  CHIEF COMPLAINT:  Respiratory distress   HISTORY OF PRESENT ILLNESS:    65 yo with history of HTN, HLD, morbid obesity, gout who came in with 4 day history of shortness of breath, worse with exertion., subjective fevers, and intermittent left sided chest soreness. His O2 sats was 63% on 3L [place]. Was on nonrebreather, and CXR shows right middle lobe atelectasis- can not exclude pneumonia. Was started on IV fluids, ceftriaxone, and azithromycin. CT of chest showed multifocal pneumonia.  He was put on bipap and by the evening, pt had worsening acidosis and hypercarbia on his repeat ABG. Pt was then intubated.  Per Dr Nemiah Commander H&P, history there was obtained by his brother-in-law, who said he was able to ambulate with thehelp of a cane. Pt started having chills and resp symptoms for 4 days.   Subjective:  No acute events overnight, not requiring pressors.  No complaints this morning while he is alert and able to follow commands.  VITAL SIGNS: BP 108/60 mmHg  Pulse 62  Temp(Src) 99.1 F (37.3 C) (Oral)  Resp 24  Ht  (1.753 m)  Wt 348 lb (157.852 kg)  BMI 51.37 kg/m2  SpO2 92%  HEMODYNAMICS:    VENTILATOR SETTINGS: Vent Mode:  [-] PRVC FiO2 (%):  [50 %] 50 % Set Rate:  [24 bmp] 24 bmp Vt Set:  [450 mL] 450 mL PEEP:  [8 cmH20] 8 cmH20 Plateau Pressure:  [24 cmH20-28 cmH20] 28 cmH20  INTAKE / OUTPUT: I/O last 3 completed shifts: In: 1827.5 [I.V.:1072.5; NG/GT:405; IV Piggyback:350] Out: 4825 [Urine:4825]  PHYSICAL EXAMINATION: General:  Intubated, alert, following commands Neuro:  Alert, opens eyes to commands, nodded to questions Cardiovascular:  RRR, no m/r/g Lungs: Decreased lung sounds at bases, clear anteriorly Abdomen:  +BS, obese Skin: Intertrigo  LABS:  BMET  Recent Labs Lab 02/23/16 1220 02/24/16 0535  02/25/16 0530  NA 140 146* 146*  K 3.4* 3.3* 3.4*  CL 98* 106 105  CO2 34* 32 31  BUN 21* 15 25*  CREATININE 1.31* 1.14 1.31*  GLUCOSE 116* 81 100*    Electrolytes  Recent Labs Lab 02/23/16 1220 02/24/16 0535 02/25/16 0530  CALCIUM 8.7* 7.6* 8.1*    CBC  Recent Labs Lab 02/23/16 1220 02/24/16 0535 02/25/16 0530  WBC 11.8* 8.7 8.4  HGB 14.2 11.3* 11.7*  HCT 45.4 39.1 39.5  PLT 214 168 171    Coag's No results for input(s): APTT, INR in the last 168 hours.  Sepsis Markers  Recent Labs Lab 02/23/16 1339  LATICACIDVEN 1.4    ABG  Recent Labs Lab 02/23/16 1740 02/23/16 1950 02/24/16 0026  PHART 7.18* 7.42 7.341*  PCO2ART 109* 50* 56.3*  PO2ART 72* 127* 86.0    Liver Enzymes  Recent Labs Lab 02/23/16 1220  AST 29  ALT 18  ALKPHOS 77  BILITOT 1.1  ALBUMIN 3.9    Cardiac Enzymes  Recent Labs Lab 02/23/16 1220  TROPONINI 0.03    Glucose  Recent Labs Lab 02/24/16 0819 02/24/16 1158 02/24/16 1604 02/24/16 1947 02/25/16 0031 02/25/16 0354  GLUCAP 91 106* 100* 98 105* 102*    Imaging Dg Chest Port 1 View  02/25/2016  CLINICAL DATA:  Respiratory failure. EXAM: PORTABLE CHEST 1 VIEW COMPARISON:  Yesterday. FINDINGS: Endotracheal to in satisfactory position. Right jugular catheter tip in the proximal superior vena  cava. The inferior portion of the nasogastric tube is not visualized. A poor inspiration is again demonstrated with decreased bibasilar airspace opacity. The cardiac silhouette remains enlarged. No acute bony abnormality. IMPRESSION: 1. Improving bibasilar atelectasis and possible pneumonia. 2. Stable cardiomegaly. Electronically Signed   By: Beckie SaltsSteven  Reid M.D.   On: 02/25/2016 07:35     STUDIES:  CXR CT angio  CULTURES: Bcx 4/14 >> No growth resp 4/15 >> pending RVP 4/15 >>  pending  ANTIBIOTICS: Vanc 4/14>> 4/14 Zosyn 4/14 > 4/14  Ceftriaxone 4/14 > Azithro 4/14>  SIGNIFICANT EVENTS:  LINES/TUBES: ETT 4/14  > NG/OG tube 4/15 Foley 4/15 CVC RIJ 4/14 PIV x 2  DISCUSSION: 65 yo man with history of HTN, HLD, morbid obesity, gout and COPD. Admitted with resp failure, suspected CAP  ASSESSMENT / PLAN:  PULMONARY A: Acute hypoxic and hypercarbic respiratory failure secondary to multifocal pneumonia and possible OSA  P:   - Attempt to wean FiO2, PEEP as tolerated, possible SBT - Oral Care - VAP prevention bundle - Daily sedation vacation and breathing trials.  - Antibiotics as below, ceftriaxone and azithromycin  - Lasix given yesterday, consider further diuresis tomorrow  CARDIOVASCULAR A:  HTN - currently pressures low-normal Possible cardiogenic pulmonary edema P:  -check TTE to eval LV fxn -given IV lasix one time 4/14 -holding anti-HTN, resume if needed  RENAL A:   CKD 3 Creatine at baseline Gout  Hypokalemia - replaced P:    -Follow BMP  GASTROINTESTINAL A:   GERD P:   -Protonix -Nutrition consult for TF  HEMATOLOGIC A:   No active issues P:  -Follow CBC  INFECTIOUS A:   Probable CAP with bilateral infiltrates P:   -ceftriaxone  -azithro -f/u pan cultures -f/u legionella  -strep pneumo negative  ENDOCRINE A:   No active issue P:   -monitor CBG q4h  RHEUMATOLOGIC A: Chronic Gout P: -resume allopurinol when taking PO   NEUROLOGIC A:   Pt intubated/sedated P:   -fentanyl gtt -versed prn  -RASS goal: -1  Gwynn BurlyAndrew Berthe Oley, DO 02/25/2016, 11:33 AM PGY-1, [facility]

## 2016-02-25 NOTE — Progress Notes (Signed)
[facility]OC[facility] FOR AM LAB REPLACEMENT ONLY  The patient does apply for the [facility] Electrolyte Replacment Protocol based on the criteria listed below:   1. Is GFR >/= 40 ml/min? Yes.    Patient's GFR today is >60 2. Is urine output >/= 0.5 ml/kg/hr for the last 6 hours? Yes.   Patient's UOP is 0.6 ml/kg/hr 3. Is BUN < 60 mg/dL? Yes.    Patient's BUN today is 25 4. Abnormal electrolyte(s): K 3.4 5. Ordered repletion with: per protocol 6. If Kennedy panic level lab has been reported, has the CCM MD in charge been notified? No..   Physician:    Markus DaftWHELAN, Dylan Kennedy 02/25/2016 7:01 AM

## 2016-02-26 ENCOUNTER — Inpatient Hospital Stay (HOSPITAL_COMMUNITY): Payer: No Typology Code available for payment source

## 2016-02-26 DIAGNOSIS — I509 Heart failure, unspecified: Secondary | ICD-10-CM

## 2016-02-26 DIAGNOSIS — Z978 Presence of other specified devices: Secondary | ICD-10-CM | POA: Insufficient documentation

## 2016-02-26 LAB — BASIC METABOLIC PANEL
Anion gap: 10 (ref 5–15)
BUN: 28 mg/dL — ABNORMAL HIGH (ref 6–20)
CHLORIDE: 105 mmol/L (ref 101–111)
CO2: 30 mmol/L (ref 22–32)
CREATININE: 1.18 mg/dL (ref 0.61–1.24)
Calcium: 8.4 mg/dL — ABNORMAL LOW (ref 8.9–10.3)
GFR calc non Af Amer: >60 mL/min (ref >60)
Glucose, Bld: 93 mg/dL (ref 65–99)
POTASSIUM: 3.6 mmol/L (ref 3.5–5.1)
Sodium: 145 mmol/L (ref 135–145)

## 2016-02-26 LAB — POCT I-STAT 3, ART BLOOD GAS (G3+)
Acid-Base Excess: 4 mmol/L — ABNORMAL HIGH (ref 0.0–2.0)
BICARBONATE: 33.6 meq/L — AB (ref 20.0–24.0)
O2 Saturation: 86 %
PO2 ART: 64 mmHg — AB (ref 80.0–100.0)
TCO2: 36 mmol/L (ref 0–100)
pCO2 arterial: 75.7 mmHg (ref 35.0–45.0)
pH, Arterial: 7.257 — ABNORMAL LOW (ref 7.350–7.450)

## 2016-02-26 LAB — GLUCOSE, CAPILLARY
GLUCOSE-CAPILLARY: 111 mg/dL — AB (ref 65–99)
GLUCOSE-CAPILLARY: 101 mg/dL — AB (ref 65–99)
Glucose-Capillary: 121 mg/dL — ABNORMAL HIGH (ref 65–99)
Glucose-Capillary: 72 mg/dL (ref 65–99)
Glucose-Capillary: 97 mg/dL (ref 65–99)
Glucose-Capillary: 98 mg/dL (ref 65–99)

## 2016-02-26 LAB — RESPIRATORY VIRUS PANEL
ADENOVIRUS: NEGATIVE
INFLUENZA A: NEGATIVE
INFLUENZA B 1: NEGATIVE
METAPNEUMOVIRUS: NEGATIVE
Parainfluenza 1: NEGATIVE
Parainfluenza 2: NEGATIVE
Parainfluenza 3: NEGATIVE
RESPIRATORY SYNCYTIAL VIRUS A: NEGATIVE
Respiratory Syncytial Virus B: NEGATIVE
Rhinovirus: NEGATIVE

## 2016-02-26 LAB — POCT I-STAT 3, VENOUS BLOOD GAS (G3P V)
Acid-Base Excess: 5 mmol/L — ABNORMAL HIGH (ref 0.0–2.0)
BICARBONATE: 34.9 meq/L — AB (ref 20.0–24.0)
O2 SAT: 42 %
PCO2 VEN: 83.3 mmHg — AB (ref 45.0–50.0)
PH VEN: 7.232 — AB (ref 7.250–7.300)
Patient temperature: 99.3
TCO2: 37 mmol/L (ref 0–100)
pO2, Ven: 30 mmHg — ABNORMAL LOW (ref 31.0–45.0)

## 2016-02-26 LAB — CBC
HCT: 39 % (ref 39.0–52.0)
HEMOGLOBIN: 11.2 g/dL — AB (ref 13.0–17.0)
MCH: 26 pg (ref 26.0–34.0)
MCHC: 28.7 g/dL — AB (ref 30.0–36.0)
MCV: 90.5 fL (ref 78.0–100.0)
Platelets: 174 10*3/uL (ref 150–400)
RBC: 4.31 MIL/uL (ref 4.22–5.81)
RDW: 17.9 % — ABNORMAL HIGH (ref 11.5–15.5)
WBC: 8.2 10*3/uL (ref 4.0–10.5)

## 2016-02-26 LAB — LEGIONELLA PNEUMOPHILA SEROGP 1 UR AG: L. pneumophila Serogp 1 Ur Ag: NEGATIVE

## 2016-02-26 LAB — MAGNESIUM: Magnesium: 2.1 mg/dL (ref 1.7–2.4)

## 2016-02-26 LAB — PHOSPHORUS: PHOSPHORUS: 3.8 mg/dL (ref 2.5–4.6)

## 2016-02-26 LAB — ECHOCARDIOGRAM COMPLETE
HEIGHTINCHES: 69 in
WEIGHTICAEL: 5584 [oz_av]

## 2016-02-26 MED ORDER — POLYETHYLENE GLYCOL 3350 17 G PO PACK
17.0000 g | PACK | Freq: Every day | ORAL | Status: DC
  Administered 2016-02-26 – 2016-03-07 (×8): 17 g via ORAL
  Filled 2016-02-26 (×11): qty 1

## 2016-02-26 MED ORDER — PERFLUTREN LIPID MICROSPHERE
1.0000 mL | INTRAVENOUS | Status: AC | PRN
  Administered 2016-02-26: 2 mL via INTRAVENOUS
  Filled 2016-02-26: qty 10

## 2016-02-26 MED ORDER — FUROSEMIDE 10 MG/ML IJ SOLN
40.0000 mg | Freq: Two times a day (BID) | INTRAMUSCULAR | Status: DC
  Administered 2016-02-26 – 2016-03-01 (×9): 40 mg via INTRAVENOUS
  Filled 2016-02-26 (×9): qty 4

## 2016-02-26 MED ORDER — DOCUSATE SODIUM 50 MG/5ML PO LIQD
200.0000 mg | Freq: Every day | ORAL | Status: DC
  Administered 2016-02-26 – 2016-03-07 (×8): 200 mg via ORAL
  Filled 2016-02-26 (×12): qty 20

## 2016-02-26 MED ORDER — POLYETHYLENE GLYCOL 3350 17 G PO PACK
17.0000 g | PACK | Freq: Every day | ORAL | Status: DC | PRN
  Filled 2016-02-26: qty 1

## 2016-02-26 MED ORDER — POTASSIUM CHLORIDE 20 MEQ PO PACK
40.0000 meq | PACK | Freq: Once | ORAL | Status: AC
  Administered 2016-02-26: 40 meq via ORAL
  Filled 2016-02-26: qty 2

## 2016-02-26 NOTE — Progress Notes (Signed)
PULMONARY / CRITICAL CARE MEDICINE   Name: Dylan Kennedy MRN: [id number] DOB: May 16, 1951    ADMISSION DATE:  02/23/2016  REFERRING MD:  [place]  CHIEF COMPLAINT:  Respiratory distress   HISTORY OF PRESENT ILLNESS:    65 yo with history of HTN, HLD, morbid obesity, gout who came in with 4 day history of shortness of breath, worse with exertion., subjective fevers, and intermittent left sided chest soreness. His O2 sats was 63% on 3L [place]. Was on nonrebreather, and CXR shows right middle lobe atelectasis- can not exclude pneumonia. Was started on IV fluids, ceftriaxone, and azithromycin. CT of chest showed multifocal pneumonia.  He was put on bipap and by the evening, pt had worsening acidosis and hypercarbia on his repeat ABG. Pt was then intubated.  Per Dr Nemiah Commander H&P, history there was obtained by his brother-in-law, who said he was able to ambulate with thehelp of a cane. Pt started having chills and resp symptoms for 4 days.   Subjective:  No acute events overnight, not requiring pressors.  Has no complaints this morning.  He is alert and following commands.  VITAL SIGNS: BP 113/60 mmHg  Pulse 74  Temp(Src) 98.9 F (37.2 C) (Oral)  Resp 22  Ht  (1.753 m)  Wt 349 lb (158.305 kg)  BMI 51.51 kg/m2  SpO2 87%  HEMODYNAMICS:    VENTILATOR SETTINGS: Vent Mode:  [-] PRVC FiO2 (%):  [50 %] 50 % Set Rate:  [24 bmp] 24 bmp Vt Set:  [450 mL] 450 mL PEEP:  [8 cmH20] 8 cmH20 Plateau Pressure:  [25 cmH20-29 cmH20] 25 cmH20  INTAKE / OUTPUT: I/O last 3 completed shifts: In: 2730 [I.V.:835; WU/JW:1191; IV Piggyback:350] Out: 2670 [Urine:2670]  PHYSICAL EXAMINATION: General:  Intubated, alert, following commands Neuro:  Alert, opens eyes to commands, nodded to questions Cardiovascular:  RRR, no m/r/g Lungs: Decreased lung sounds at bases, clear anteriorly Abdomen:  +BS, obese, non-tender Skin: Intertrigo  LABS:  BMET  Recent Labs Lab 02/24/16 0535 02/25/16 0530  02/26/16 0515  NA 146* 146* 145  K 3.3* 3.4* 3.6  CL 106 105 105  CO2 32 31 30  BUN 15 25* 28*  CREATININE 1.14 1.31* 1.18  GLUCOSE 81 100* 93    Electrolytes  Recent Labs Lab 02/24/16 0535 02/25/16 0530 02/26/16 0515  CALCIUM 7.6* 8.1* 8.4*  MG  --   --  2.1  PHOS  --   --  3.8    CBC  Recent Labs Lab 02/24/16 0535 02/25/16 0530 02/26/16 0515  WBC 8.7 8.4 8.2  HGB 11.3* 11.7* 11.2*  HCT 39.1 39.5 39.0  PLT 168 171 174    Coag's No results for input(s): APTT, INR in the last 168 hours.  Sepsis Markers  Recent Labs Lab 02/23/16 1339  LATICACIDVEN 1.4    ABG  Recent Labs Lab 02/23/16 1740 02/23/16 1950 02/24/16 0026  PHART 7.18* 7.42 7.341*  PCO2ART 109* 50* 56.3*  PO2ART 72* 127* 86.0    Liver Enzymes  Recent Labs Lab 02/23/16 1220  AST 29  ALT 18  ALKPHOS 77  BILITOT 1.1  ALBUMIN 3.9    Cardiac Enzymes  Recent Labs Lab 02/23/16 1220  TROPONINI 0.03    Glucose  Recent Labs Lab 02/25/16 0031 02/25/16 0354 02/25/16 1212 02/25/16 1605 02/26/16 0008 02/26/16 0303  GLUCAP 105* 102* 91 91 98 111*    Imaging No results found.   STUDIES:  CXR CT angio  CULTURES: Bcx 4/14 >> No growth resp  4/15 >> pending RVP 4/15 >>  pending  ANTIBIOTICS: Vanc 4/14>> 4/14 Zosyn 4/14 > 4/14  Ceftriaxone 4/14 > Azithro 4/14>plan 4/18  SIGNIFICANT EVENTS: Pos balance  LINES/TUBES: ETT 4/14 > NG/OG tube 4/15 Foley 4/15 CVC RIJ 4/14 PIV x 2  DISCUSSION: 65 yo man with history of HTN, HLD, morbid obesity, gout and COPD. Admitted with resp failure, suspected CAP  ASSESSMENT / PLAN:  PULMONARY A: Acute hypoxic and hypercarbic respiratory failure secondary to multifocal pneumonia and possible OSA  R/o pulm edema on top likely some pickwickian / OHS P:   - PS 12 required weaning, cpap 8 - Oral Care - Antibiotics as below, ceftriaxone and azithromycin  - Lasix given 4/14, consider further diuresis top neg 1 liter  goal -pcxr in am -consider rate reduction,. Avoid with pickwickian -keep plat less 30  -lasix restart  CARDIOVASCULAR A:  HTN - currently pressures low-normal Possible cardiogenic pulmonary edema P:  -check TTE to eval LV fxn-pending -given IV lasix one time 4/14 -holding anti-HTN, resume if needed  RENAL A:   CKD 3 Creatine at baseline Gout  Pos balance P:    -Follow BMP k supp with lasix start  Lasix start  GASTROINTESTINAL A:   GERD P:   -Protonix -Nutrition consult for TF BM needed  HEMATOLOGIC A:   No active issues dvt prevention P:  -Follow CBC -lovenox  INFECTIOUS A:   Probable CAP with bilateral infiltrates P:   -ceftriaxone add stop date -azithro add stop date  -f/u pan cultures -f/u legionella - awaited -strep pneumo negative Ensure flu sent  ENDOCRINE A:   No active issue P:   -monitor CBG q4h  RHEUMATOLOGIC A: Chronic Gout P: -resume allopurinol when taking PO   NEUROLOGIC A:   Pt intubated/sedated P:   -fentanyl gtt -versed prn  -RASS goal: -1  [name], DO 02/26/2016, 7:33 AM PGY-1, [facility]    STAFF NOTE: I, Rory Percyaniel Gaige Fussner, MD FACP have personally reviewed patient's available data, including medical history, events of note, physical examination and test results as part of my evaluation. I have discussed with resident/NP and other care providers such as pharmacist, RN and RRT. In addition, I personally evaluated patient and elicited key findings of: coarse ronchi, obese, in bed, pcxr small volumes, some improvement in alveolar process, wean PS 12 required,  May benefit from trach by end of week, continue lasix to neg 1 liter goals, k supp, ensure flu sent, avoid over blowing co2 in setting os OHS noted on chem and abg on admission, osa also now fixed with ett, chem in am , kvo, echo for pa pressures awaited, add stop date [place]ak 5 days, add stop date cap 7  The patient is critically ill with  multiple organ systems failure and requires high complexity decision making for assessment and support, frequent evaluation and titration of therapies, application of advanced monitoring technologies and extensive interpretation of multiple databases.   Critical Care Time devoted to patient care services described in this note is35 Minutes. This time reflects time of care of this signee: Rory Percyaniel Deepti Gunawan, MD FACP. This critical care time does not reflect procedure time, or teaching time or supervisory time of PA/NP/Med student/Med Resident etc but could involve care discussion time. Rest per NP/medical resident whose note is outlined above and that I agree with   Mcarthur Rossettianiel J. Tyson AliasFeinstein, MD, FACP Pgr: [phone]  Pulmonary & Critical Care 02/26/2016 11:00 AM

## 2016-02-26 NOTE — Progress Notes (Signed)
  Echocardiogram 2D Echocardiogram with definity 2 mL has been performed.  Leta JunglingCooper, Demaris Leavell M 02/26/2016, 12:59 PM

## 2016-02-26 NOTE — Progress Notes (Signed)
RT bag lavaged at this time. Patient SAT was hanging around 86% and had lavaged for plugs. Patient tolerated well, placed back on vent, SAT 94%. ABG will be obtained at 1245

## 2016-02-27 ENCOUNTER — Inpatient Hospital Stay (HOSPITAL_COMMUNITY): Payer: No Typology Code available for payment source

## 2016-02-27 DIAGNOSIS — J9602 Acute respiratory failure with hypercapnia: Secondary | ICD-10-CM

## 2016-02-27 DIAGNOSIS — Z789 Other specified health status: Secondary | ICD-10-CM

## 2016-02-27 DIAGNOSIS — N183 Chronic kidney disease, stage 3 (moderate): Secondary | ICD-10-CM

## 2016-02-27 DIAGNOSIS — J189 Pneumonia, unspecified organism: Secondary | ICD-10-CM

## 2016-02-27 DIAGNOSIS — B372 Candidiasis of skin and nail: Secondary | ICD-10-CM

## 2016-02-27 LAB — BASIC METABOLIC PANEL
Anion gap: 11 (ref 5–15)
BUN: 31 mg/dL — AB (ref 6–20)
CALCIUM: 8.8 mg/dL — AB (ref 8.9–10.3)
CHLORIDE: 103 mmol/L (ref 101–111)
CO2: 32 mmol/L (ref 22–32)
CREATININE: 1.1 mg/dL (ref 0.61–1.24)
GFR calc non Af Amer: >60 mL/min (ref >60)
GLUCOSE: 99 mg/dL (ref 65–99)
Potassium: 3.6 mmol/L (ref 3.5–5.1)
Sodium: 146 mmol/L — ABNORMAL HIGH (ref 135–145)

## 2016-02-27 LAB — CBC
HCT: 38.5 % — ABNORMAL LOW (ref 39.0–52.0)
Hemoglobin: 11.2 g/dL — ABNORMAL LOW (ref 13.0–17.0)
MCH: 26.2 pg (ref 26.0–34.0)
MCHC: 29.1 g/dL — AB (ref 30.0–36.0)
MCV: 90 fL (ref 78.0–100.0)
PLATELETS: 163 10*3/uL (ref 150–400)
RBC: 4.28 MIL/uL (ref 4.22–5.81)
RDW: 17.4 % — ABNORMAL HIGH (ref 11.5–15.5)
WBC: 7.9 10*3/uL (ref 4.0–10.5)

## 2016-02-27 LAB — GLUCOSE, CAPILLARY
GLUCOSE-CAPILLARY: 110 mg/dL — AB (ref 65–99)
GLUCOSE-CAPILLARY: 96 mg/dL (ref 65–99)
Glucose-Capillary: 89 mg/dL (ref 65–99)
Glucose-Capillary: 90 mg/dL (ref 65–99)
Glucose-Capillary: 100 mg/dL — ABNORMAL HIGH (ref 65–99)
Glucose-Capillary: 92 mg/dL (ref 65–99)

## 2016-02-27 LAB — PHOSPHORUS: Phosphorus: 3.7 mg/dL (ref 2.5–4.6)

## 2016-02-27 LAB — MAGNESIUM: MAGNESIUM: 2.2 mg/dL (ref 1.7–2.4)

## 2016-02-27 MED ORDER — LACTULOSE 10 GM/15ML PO SOLN
30.0000 g | Freq: Once | ORAL | Status: AC
  Administered 2016-02-27: 30 g
  Filled 2016-02-27: qty 45

## 2016-02-27 MED ORDER — LACTULOSE 10 GM/15ML PO SOLN: 30.0000 g | Freq: Once | ORAL | Status: DC

## 2016-02-27 MED ORDER — POTASSIUM CHLORIDE 20 MEQ PO PACK
40.0000 meq | PACK | Freq: Once | ORAL | Status: AC
  Administered 2016-02-27: 40 meq via ORAL
  Filled 2016-02-27: qty 2

## 2016-02-27 MED ORDER — PANTOPRAZOLE SODIUM 40 MG PO PACK
40.0000 mg | PACK | Freq: Every day | ORAL | Status: DC
  Administered 2016-02-27 – 2016-02-28 (×2): 40 mg
  Filled 2016-02-27 (×3): qty 20

## 2016-02-27 MED ORDER — FREE WATER
200.0000 mL | Status: DC
  Administered 2016-02-27 – 2016-02-29 (×10): 200 mL

## 2016-02-27 NOTE — Evaluation (Signed)
Physical Therapy Evaluation Patient Details Name: Dylan BottomsJames Kennedy MRN: [id number] DOB: 07/13/1951 Today's Date: 02/27/2016   History of Present Illness  65 yo with history of HTN, HLD, morbid obesity, gout who came in with 4 day history of shortness of breath, worse with exertion., subjective fevers, and intermittent left sided chest soreness. His O2 sats was 63% on 3L [place]. Was on nonrebreather, and CXR shows right middle lobe atelectasis- can not exclude pneumonia. . CT of chest showed multifocal pneumonia.  Failed on bipap so was intubated.  Clinical Impression  Patient presents with decreased independence with mobility due to deficits listed in PT problem list.  He will benefit from skilled PT in the acute setting to allow d/c home following CIR level rehab stay.    Follow Up Recommendations CIR    Equipment Recommendations  Other (comment) (TBA)    Recommendations for Other Services Rehab consult;OT consult     Precautions / Restrictions Precautions Precautions: Fall      Mobility  Bed Mobility Overal bed mobility: Needs Assistance Bed Mobility: Supine to Sit     Supine to sit: Mod assist;+2 for safety/equipment     General bed mobility comments: up from bari air bed with increased time due to air mattress and body habitus causing difficulty getting feet to floor.  Assist for safety  Transfers Overall transfer level: Needs assistance   Transfers: Sit to/from Stand;Stand Pivot Transfers Sit to Stand: +2 safety/equipment;Min assist Stand pivot transfers: Mod assist;+2 safety/equipment       General transfer comment: stood from high bed with little assist and pivot [facility], then to recliner with LOB x 1 mod A to recover  Ambulation/Gait                Stairs            Wheelchair Mobility    Modified Rankin (Stroke Patients Only)       Balance Overall balance assessment: Needs assistance Sitting-balance support: Feet supported Sitting  balance-Leahy Scale: Fair       Standing balance-Leahy Scale: Fair Standing balance comment: static balance good (though did hold onto chair while standing for perineal hygiene), needs assist to step around to BSC/recliner                             Pertinent Vitals/Pain Pain Assessment: Faces Faces Pain Scale: Hurts little more Pain Location: abdomen Pain Descriptors / Indicators: Grimacing Pain Intervention(s): Monitored during session;Repositioned    Home Living Family/patient expects to be discharged to:: Private residence Living Arrangements: Alone   Type of Home: House       Home Layout: Two level Home Equipment: Cane - single point      Prior Function Level of Independence: Independent with assistive device(s)         Comments: used cane PTA     Hand Dominance        Extremity/Trunk Assessment   Upper Extremity Assessment: Overall WFL for tasks assessed           Lower Extremity Assessment: Overall WFL for tasks assessed         Communication   Communication: Other (comment) (intubated)  Cognition Arousal/Alertness: Awake/alert Behavior During Therapy: WFL for tasks assessed/performed Overall Cognitive Status: Difficult to assess                      General Comments      Exercises  Assessment/Plan    PT Assessment Patient needs continued PT services  PT Diagnosis Generalized weakness;Difficulty walking   PT Problem List Decreased activity tolerance;Decreased strength;Decreased balance;Decreased knowledge of use of DME;Decreased mobility;Cardiopulmonary status limiting activity  PT Treatment Interventions DME instruction;Balance training;Gait training;Functional mobility training;Patient/family education;Therapeutic activities;Therapeutic exercise;Stair training   PT Goals (Current goals can be found in the Care Plan section) Acute Rehab PT Goals Patient Stated Goal: unable to state PT Goal Formulation:  Patient unable to participate in goal setting Time For Goal Achievement: 03/12/16 Potential to Achieve Goals: Good    Frequency Min 3X/week   Barriers to discharge        Co-evaluation               End of Session Equipment Utilized During Treatment: Gait belt Activity Tolerance: Patient tolerated treatment well Patient left: in chair;with call bell/phone within reach;with nursing/sitter in room           Time: 1420-1454 PT Time Calculation (min) (ACUTE ONLY): 34 min   Charges:   PT Evaluation $PT Eval High Complexity: 1 Procedure PT Treatments $Therapeutic Activity: 8-22 mins   PT G CodesElray Mcgregor 03/10/2016, 4:56 PM  Sheran Lawless, PT [phone] 03-10-16

## 2016-02-27 NOTE — Progress Notes (Signed)
PULMONARY / CRITICAL CARE MEDICINE   Name: Dylan Kennedy MRN: [id number] DOB: [birth year]    ADMISSION DATE:  02/23/2016  REFERRING MD:  [place]  CHIEF COMPLAINT:  Respiratory distress   HISTORY OF PRESENT ILLNESS:    65 yo with history of HTN, HLD, morbid obesity, gout who came in with 4 day history of shortness of breath, worse with exertion., subjective fevers, and intermittent left sided chest soreness. His O2 sats was 63% on 3L [place]. Was on nonrebreather, and CXR shows right middle lobe atelectasis- can not exclude pneumonia. Was started on IV fluids, ceftriaxone, and azithromycin. CT of chest showed multifocal pneumonia.  He was put on bipap and by the evening, pt had worsening acidosis and hypercarbia on his repeat ABG. Pt was then intubated.  Per Dr Nemiah Commander H&P, history there was obtained by his brother-in-law, who said he was able to ambulate with thehelp of a cane. Pt started having chills and resp symptoms for 4 days.   Subjective:  Patient is awake, alert, needs to have a bowel movement.  VITAL SIGNS: BP 109/63 mmHg  Pulse 61  Temp(Src) 99.5 F (37.5 C) (Oral)  Resp 24  Ht  (1.753 m)  Wt 333 lb (151.048 kg)  BMI 49.15 kg/m2  SpO2 92%  HEMODYNAMICS:    VENTILATOR SETTINGS: Vent Mode:  [-] PRVC FiO2 (%):  [50 %-60 %] 60 % Set Rate:  [18 bmp-24 bmp] 24 bmp Vt Set:  [450 mL] 450 mL PEEP:  [8 cmH20] 8 cmH20 Pressure Support:  [12 cmH20] 12 cmH20 Plateau Pressure:  [17 cmH20-26 cmH20] 17 cmH20  INTAKE / OUTPUT: I/O last 3 completed shifts: In: 2638.2 [I.V.:528.2; NG/GT:2010; IV Piggyback:100] Out: 3825 [Urine:3825]  PHYSICAL EXAMINATION: General:  Intubated, alert, following commands Neuro:  Alert, opens eyes to commands, nodded to questions Cardiovascular:  RRR, no m/r/g Lungs: Decreased lung sounds at bases, clear anteriorly Abdomen:  +BS, obese, non-tender Skin: Intertrigo  LABS:  BMET  Recent Labs Lab 02/25/16 0530 02/26/16 0515  02/27/16 0330  NA 146* 145 146*  K 3.4* 3.6 3.6  CL 105 105 103  CO2 31 30 32  BUN 25* 28* 31*  CREATININE 1.31* 1.18 1.10  GLUCOSE 100* 93 99    Electrolytes  Recent Labs Lab 02/25/16 0530 02/26/16 0515 02/27/16 0330  CALCIUM 8.1* 8.4* 8.8*  MG  --  2.1 2.2  PHOS  --  3.8 3.7    CBC  Recent Labs Lab 02/25/16 0530 02/26/16 0515 02/27/16 0330  WBC 8.4 8.2 7.9  HGB 11.7* 11.2* 11.2*  HCT 39.5 39.0 38.5*  PLT 171 174 163    Coag's No results for input(s): APTT, INR in the last 168 hours.  Sepsis Markers  Recent Labs Lab 02/23/16 1339  LATICACIDVEN 1.4    ABG  Recent Labs Lab 02/23/16 1950 02/24/16 0026 02/26/16 1324  PHART 7.42 7.341* 7.257*  PCO2ART 50* 56.3* 75.7*  PO2ART 127* 86.0 64.0*    Liver Enzymes  Recent Labs Lab 02/23/16 1220  AST 29  ALT 18  ALKPHOS 77  BILITOT 1.1  ALBUMIN 3.9    Cardiac Enzymes  Recent Labs Lab 02/23/16 1220  TROPONINI 0.03    Glucose  Recent Labs Lab 02/26/16 0808 02/26/16 1155 02/26/16 1612 02/26/16 2010 02/26/16 2345 02/27/16 0328  GLUCAP 97 121* 72 101* 96 89    Imaging Dg Chest Port 1 View  02/27/2016  CLINICAL DATA:  Intubation EXAM: PORTABLE CHEST 1 VIEW COMPARISON:  02/25/2016 FINDINGS: Endotracheal tube  remains in good position. NG tube in place with the tip not visualized. Right jugular central venous catheter tip in the SVC. No pneumothorax Improved aeration in the lung bases. Improved lung volume and decrease in bibasilar atelectasis. Negative for edema or effusion. IMPRESSION: Endotracheal tube remains in good position Improvement in bibasilar atelectasis.  Improved lung volume. Electronically Signed   By: Marlan Palau M.D.   On: 02/27/2016 07:03     STUDIES:  CXR CT angio  CULTURES: Bcx 4/14 >> No growth RVP 4/15 >>  negative Trach aspirate 4/17 >> no organism on Gram stain  ANTIBIOTICS: Vanc 4/14>> 4/14 Zosyn 4/14 > 4/14  Ceftriaxone 4/14 >plan 4/20 Azithro  4/14>plan 4/18  SIGNIFICANT EVENTS: Pos balance  LINES/TUBES: ETT 4/14 > NG/OG tube 4/15 Foley 4/15 CVC RIJ 4/14 PIV x 2  DISCUSSION: 65 yo man with history of HTN, HLD, morbid obesity, gout and COPD. Admitted with resp failure, suspected CAP  ASSESSMENT / PLAN:  PULMONARY A: Acute hypoxic and hypercarbic respiratory failure secondary to multifocal pneumonia and possible OSA  R/o pulm edema on top likely some pickwickian / OHS P:   - Oral Care - Antibiotics as below, ceftriaxone and azithromycin  - Lasix  BID, maintain see renal -keep plat less 30  -CXR 4/18 >> ett tube in place, improved lung volumes, improved bibasilar atelectasis -abg in am for MV needs again -wean mandatory PS 10 start attempt  CARDIOVASCULAR A:  HTN - currently pressures low-normal Possible cardiogenic pulmonary edema P:  -TTE w/ EF 65-70%, no wall motion abnormalities, normal PA pressure -lasix restarted, currently net negative 3.8L since admit, keep -holding other anti-HTN, resume if needed  RENAL A:   CKD 3 Creatine at baseline Gout  Pos balance P:    -Follow BMP - potassium supplement as needed with Lasix  GASTROINTESTINAL A:   GERD GI PPx P:   -Protonix -Tube feeds -BM needed, Miralax started 4/17 but still no BM.  Give Lactulose 30g once -may need enema  HEMATOLOGIC A:   No active issues dvt prevention P:  -Follow CBC -lovenox  INFECTIOUS A:   Probable CAP with bilateral infiltrates P:   -ceftriaxone add stop date 4/20 -azithro add stop date 4/18 -f/u pan cultures - negative to date -f/u legionella negative -strep pneumo negative -flu negative, RSV negative  ENDOCRINE A:   No active issue P:   -monitor CBG q4h  RHEUMATOLOGIC A: Chronic Gout P: -resume allopurinol when taking PO   NEUROLOGIC A:   Pt intubated/sedated P:   -fentanyl gtt -versed prn  -RASS goal: -1  Gwynn Burly, DO 02/27/2016, 8:01 AM PGY-1, [facility]   STAFF NOTE: Cindi Carbon, MD FACP have personally reviewed patient's available data, including medical history, events of note, physical examination and test results as part of my evaluation. I have discussed with resident/NP and other care providers such as pharmacist, RN and RRT. In addition, I personally evaluated patient and elicited key findings of: coarse, reduced BS, in bed, obese, edema, pcxr much improved after NEG balance 1.4 liters, repeat wean cpap 5 ps10 , reduce pS if able, ECHO reviewed, hyperdynamic? But responding well to lasix and neg balance, maintains, may need reduction lasix dose, NA add free water, allow azithro to dc, ceftriaxone to 20th, get PT , walk him in icu, delirium risk, TURN LIGHTS ON DAYTIME!, WUA The patient is critically ill with multiple organ systems failure and requires high complexity decision making for assessment and support,  frequent evaluation and titration of therapies, application of advanced monitoring technologies and extensive interpretation of multiple databases.   Critical Care Time devoted to patient care services described in this note is 30 Minutes. This time reflects time of care of this signee: Rory Percyaniel Feinstein, MD FACP. This critical care time does not reflect procedure time, or teaching time or supervisory time of PA/NP/Med student/Med Resident etc but could involve care discussion time. Rest per NP/medical resident whose note is outlined above and that I agree with   Mcarthur Rossettianiel J. Tyson AliasFeinstein, MD, FACP Pgr: [phone] [facility] 02/27/2016 10:55 AM

## 2016-02-27 NOTE — Progress Notes (Signed)
Fentanyl gtt wasted 125cc in sink. Two nurse Merlyn Albert(Olson Lucarelli and DeDe RN) verification.

## 2016-02-27 NOTE — Progress Notes (Signed)
Tube feedings stopped at 1615 for slight extension of OG tube during transferring of patient to chair. Two nurse verification Fatima Sanger(Schyler Butikofer RN and DeDe Mayford Knifeurner RN) + auscultation, TF restarted back.

## 2016-02-27 NOTE — Progress Notes (Signed)
Pharmacy Antibiotic Note  Dylan BottomsJames Kennedy is a 65 y.o. male admitted on 02/23/2016 with pneumonia.  Pharmacy has been consulted for Ceftriaxone dosing.  Plan: -Ceftriaxone 2g IV q24h (stop date 4/20) -Azithro per MD (stop date 4/18) -Pharmacy will sign off  Height: 5\' 9"  (175.3 cm) Weight: (!) 333 lb (151.048 kg) IBW/kg (Calculated) : 70.7  Temp (24hrs), Avg:99 F (37.2 C), Min:98.1 F (36.7 C), Max:99.5 F (37.5 C)   Recent Labs Lab 02/23/16 1220 02/23/16 1339 02/24/16 0535 02/25/16 0530 02/26/16 0515 02/27/16 0330  WBC 11.8*  --  8.7 8.4 8.2 7.9  CREATININE 1.31*  --  1.14 1.31* 1.18 1.10  LATICACIDVEN  --  1.4  --   --   --   --     Estimated Creatinine Clearance: 99.9 mL/min (by C-G formula based on Cr of 1.1).    Allergies  Allergen Reactions  . Bee Venom Hives and Itching   Antibiotics this Admission: 4/14 azithro > (4/18)  4/14 ctx >(4/20)  4/14 zosyn x1, vanc x1  Cultures this Admission:  4/15 blood cx: ngtd  4/15 resp cx: no result  4/15 RVP: neg  4/15 mrsa: neg  Sandi CarneNick Missy Baksh, PharmD Pharmacy Resident Pager: [phone] 02/27/2016 9:20 AM

## 2016-02-27 NOTE — Progress Notes (Addendum)
Rehab Admissions Coordinator Note:  Patient was screened by Clois DupesBoyette, Firas Guardado Godwin for appropriateness for an Inpatient Acute Rehab Consult per PT recommendation.   At this time, we are recommending an inpt rehab consult z and OT eval.. Please place order.Clois Dupes.  Hadlee Burback Godwin 02/27/2016, 5:37 PM  I can be reached at [phone].

## 2016-02-28 LAB — CULTURE, BLOOD (ROUTINE X 2): Culture: NO GROWTH

## 2016-02-28 LAB — BLOOD GAS, ARTERIAL
ACID-BASE EXCESS: 6.4 mmol/L — AB (ref 0.0–2.0)
Bicarbonate: 30.5 mEq/L — ABNORMAL HIGH (ref 20.0–24.0)
DRAWN BY: 418751
FIO2: 0.6
MECHVT: 450 mL
O2 Saturation: 97.4 %
PEEP/CPAP: 8 cmH2O
PO2 ART: 95.6 mmHg (ref 80.0–100.0)
Patient temperature: 98.6
RATE: 24 resp/min
TCO2: 31.9 mmol/L (ref 0–100)
pCO2 arterial: 44.8 mmHg (ref 35.0–45.0)
pH, Arterial: 7.448 (ref 7.350–7.450)

## 2016-02-28 LAB — BASIC METABOLIC PANEL
Anion gap: 9 (ref 5–15)
BUN: 32 mg/dL — AB (ref 6–20)
CHLORIDE: 103 mmol/L (ref 101–111)
CO2: 31 mmol/L (ref 22–32)
Calcium: 9.3 mg/dL (ref 8.9–10.3)
Creatinine, Ser: 1.06 mg/dL (ref 0.61–1.24)
Glucose, Bld: 99 mg/dL (ref 65–99)
POTASSIUM: 3.8 mmol/L (ref 3.5–5.1)
SODIUM: 143 mmol/L (ref 135–145)

## 2016-02-28 LAB — CULTURE, RESPIRATORY

## 2016-02-28 LAB — GLUCOSE, CAPILLARY
GLUCOSE-CAPILLARY: 103 mg/dL — AB (ref 65–99)
GLUCOSE-CAPILLARY: 107 mg/dL — AB (ref 65–99)
GLUCOSE-CAPILLARY: 114 mg/dL — AB (ref 65–99)
GLUCOSE-CAPILLARY: 126 mg/dL — AB (ref 65–99)
Glucose-Capillary: 114 mg/dL — ABNORMAL HIGH (ref 65–99)
Glucose-Capillary: 100 mg/dL — ABNORMAL HIGH (ref 65–99)

## 2016-02-28 LAB — CULTURE, RESPIRATORY W GRAM STAIN: Culture: NORMAL

## 2016-02-28 NOTE — Progress Notes (Signed)
PULMONARY / CRITICAL CARE MEDICINE   Name: Dylan Kennedy MRN: [id number] DOB: Sep 09, 1951    ADMISSION DATE:  02/23/2016  REFERRING MD:  [place]  CHIEF COMPLAINT:  Respiratory distress   HISTORY OF PRESENT ILLNESS:    65 yo with history of HTN, HLD, morbid obesity, gout who came in with 4 day history of shortness of breath, worse with exertion., subjective fevers, and intermittent left sided chest soreness. His O2 sats was 63% on 3L [place]. Was on nonrebreather, and CXR shows right middle lobe atelectasis- can not exclude pneumonia. Was started on IV fluids, ceftriaxone, and azithromycin. CT of chest showed multifocal pneumonia.  He was put on bipap and by the evening, pt had worsening acidosis and hypercarbia on his repeat ABG. Pt was then intubated.  Per Dr Nemiah Commander H&P, history there was obtained by his brother-in-law, who said he was able to ambulate with thehelp of a cane. Pt started having chills and resp symptoms for 4 days.   Subjective:  Patient awake and alert.  Needs to have a bowel movement.  He weaned well yesterday.  PT evaluated him and recommends CIR. Neg 1.4 liters  VITAL SIGNS: BP 120/73 mmHg  Pulse 55  Temp(Src) 98.7 F (37.1 C) (Oral)  Resp 21  Ht  (1.753 m)  Wt 333 lb (151.048 kg)  BMI 49.15 kg/m2  SpO2 95%  HEMODYNAMICS:    VENTILATOR SETTINGS: Vent Mode:  [-] PRVC FiO2 (%):  [50 %-60 %] 50 % Set Rate:  [24 bmp] 24 bmp Vt Set:  [450 mL] 450 mL PEEP:  [8 cmH20] 8 cmH20 Pressure Support:  [12 cmH20] 12 cmH20 Plateau Pressure:  [19 cmH20-24 cmH20] 22 cmH20  INTAKE / OUTPUT: I/O last 3 completed shifts: In: 1831.3 [I.V.:70; NG/GT:1661.3; IV Piggyback:100] Out: 3745 [Urine:3745]  PHYSICAL EXAMINATION: General:  Intubated, alert, following commands Neuro:  Alert, opens eyes to commands, nodded to questions Cardiovascular:  RRR, no m/r/g Lungs: Decreased lung sounds at bases, clear anteriorly Abdomen:  +BS, obese, non-tender Skin:  Intertrigo  LABS:  BMET  Recent Labs Lab 02/26/16 0515 02/27/16 0330 02/28/16 0505  NA 145 146* 143  K 3.6 3.6 3.8  CL 105 103 103  CO2 30 32 31  BUN 28* 31* 32*  CREATININE 1.18 1.10 1.06  GLUCOSE 93 99 99    Electrolytes  Recent Labs Lab 02/26/16 0515 02/27/16 0330 02/28/16 0505  CALCIUM 8.4* 8.8* 9.3  MG 2.1 2.2  --   PHOS 3.8 3.7  --     CBC  Recent Labs Lab 02/25/16 0530 02/26/16 0515 02/27/16 0330  WBC 8.4 8.2 7.9  HGB 11.7* 11.2* 11.2*  HCT 39.5 39.0 38.5*  PLT 171 174 163    Coag's No results for input(s): APTT, INR in the last 168 hours.  Sepsis Markers  Recent Labs Lab 02/23/16 1339  LATICACIDVEN 1.4    ABG  Recent Labs Lab 02/24/16 0026 02/26/16 1324 02/28/16 0335  PHART 7.341* 7.257* 7.448  PCO2ART 56.3* 75.7* 44.8  PO2ART 86.0 64.0* 95.6    Liver Enzymes  Recent Labs Lab 02/23/16 1220  AST 29  ALT 18  ALKPHOS 77  BILITOT 1.1  ALBUMIN 3.9    Cardiac Enzymes  Recent Labs Lab 02/23/16 1220  TROPONINI 0.03    Glucose  Recent Labs Lab 02/27/16 0758 02/27/16 1140 02/27/16 1548 02/27/16 1952 02/28/16 0031 02/28/16 0349  GLUCAP 90 110* 100* 92 107* 114*    Imaging No results found.   STUDIES:  CXR  CT angio  CULTURES: Bcx 4/14 >> No growth RVP 4/15 >>  negative Trach aspirate 4/17 >> no growth  ANTIBIOTICS: Vanc 4/14>> 4/14 Zosyn 4/14 > 4/14  Ceftriaxone 4/14 >plan 4/20 Azithro 4/14>plan 4/18  SIGNIFICANT EVENTS: Neg balance 1.4 liters 4/18  LINES/TUBES: ETT 4/14 > NG/OG tube 4/15 Foley 4/15 CVC RIJ 4/14 PIV x 2  DISCUSSION: 65 yo man with history of HTN, HLD, morbid obesity, gout and COPD. Admitted with resp failure, suspected CAP  ASSESSMENT / PLAN:  PULMONARY A: Acute hypoxic and hypercarbic respiratory failure secondary to multifocal pneumonia and possible OSA  R/o pulm edema on top likely some pickwickian / OHS P:   - Oral Care - Antibiotics as below  - Lasix 40mg   BID, maintain see renal - keep plat less 30  - ABG this AM: pH 7.45, pCO2 44, pO2 95, HCO3 31, can reduce rate 20, to 40% goal then peep reduction - attempting to wean, if unable to extubate may need trach  CARDIOVASCULAR A:  HTN - currently pressures stable Possible cardiogenic pulmonary edema P:  -TTE w/ EF 65-70%, no wall motion abnormalities, normal PA pressure -lasix continued.  Negative 1.5L last 24 hours -holding other anti-HTN, resume if needed -tolerating lasix well  RENAL A:   CKD 3 Creatine at baseline Gout  Pos balance P:    -Follow BMP - potassium supplement as needed with Lasix -excellent dose of lasix provided  GASTROINTESTINAL A:   GERD GI PPx Constipation - having BM's P:   -Protonix -Tube feeds -Miralax  HEMATOLOGIC A:   dvt prevention P:  -Follow CBC -lovenox  INFECTIOUS A:   Probable CAP with bilateral infiltrates P:   -ceftriaxone stop date 4/20 -azithro done 4/18 -f/u pan cultures - negative to date -legionella negative -strep pneumo negative -flu negative, RSV negative  ENDOCRINE A:   No active issue P:   -monitor CBG q4h  RHEUMATOLOGIC A: Chronic Gout P: -resume allopurinol when taking PO  NEUROLOGIC A:   Pt intubated/sedated but requiring neither P:   -fentanyl gtt - off currently -versed prn  -RASS goal: -1 GOAL IS TO WALK IN THE ICU ON THE VENT  [name], DO 02/28/2016, 7:40 AM PGY-1, [facility]    STAFF NOTE: Cindi CarbonI, Vint Pola, MD FACP have personally reviewed patient's available data, including medical history, events of note, physical examination and test results as part of my evaluation. I have discussed with resident/NP and other care providers such as pharmacist, RN and RRT. In addition, I personally evaluated patient and elicited key findings of: lungs sounds improved, neg over 1 liter and renal fxn tolerated well, ABG reviewed, rate reduction, peep to remain 8 until to 40%,  maintain neg baalnce on current dose, upright position, walk on vent is goal with PT, abx to stop date, chem in am , kvo, improving slowly The patient is critically ill with multiple organ systems failure and requires high complexity decision making for assessment and support, frequent evaluation and titration of therapies, application of advanced monitoring technologies and extensive interpretation of multiple databases.   Critical Care Time devoted to patient care services described in this note is 30 Minutes. This time reflects time of care of this signee: Rory Percyaniel Darchelle Nunes, MD FACP. This critical care time does not reflect procedure time, or teaching time or supervisory time of PA/NP/Med student/Med Resident etc but could involve care discussion time. Rest per NP/medical resident whose note is outlined above and that I agree with  Mcarthur Rossetti. Tyson Alias, MD, FACP Pgr: [phone] [facility] 02/28/2016 9:09 AM

## 2016-02-28 NOTE — Progress Notes (Signed)
Thank you for consult on Mr. Whisnant. He continues to be intubated and will likely require trach per discussion with MD.  If patient continues to require Vent support he will require LTACH for rehab.  Recommend CIR consult once patient able to tolerate extubation X 24 hours.

## 2016-02-28 NOTE — Consult Note (Signed)
Physical Medicine and Rehabilitation Consult   Reason for Consult:  Debility due to multifocal PNA with respiratory failure in setting of OHS.  Referring Physician:  Dr. Molli Knock.   HPI: Dylan Kennedy is a 65 y.o. male with history of HTN, morbid obesity--BMI 55, gout who was admitted with 4 day history of SOB, fevers and left chest soreness. He was hypoxia at admission to [facility] hospital on 02/23/16 and CXR showed of  RML PNA. He was started on IVF and antibiotics and placed on BIPAP. He had worsening of acidosis with hypercarbia and was intubated for airway support and transferred to [facility] for treatment.  He was treated with IV lasix for possible cardiogenic pulmonary edema and cultures negative to date.  On ceftriaxone and Azithromycin for multifocal PNA in setting of acute hypoxic and hypercarbic respiratory failure and possible OSA/OHS in setting of morbid obesity. Has been walking on vent in ICU.  He tolerated extubation yesterday and and noted to be deconditioned. CIR recommended for follow up therapy.    Review of Systems  HENT: Negative for hearing loss.   Eyes: Negative for blurred vision.  Respiratory: Positive for cough and sputum production. Negative for shortness of breath.   Cardiovascular: Negative for chest pain and palpitations.  Gastrointestinal: Positive for heartburn (regurgitation. ). Negative for vomiting.       Hungry-- hasn't had anything to eat.   Genitourinary: Negative for dysuria and urgency.  Musculoskeletal: Negative for myalgias and back pain.  Neurological: Negative for dizziness, sensory change and headaches.  Psychiatric/Behavioral: The patient has insomnia.       Past Medical History  Diagnosis Date  . Hypertension   . Gout   . GERD (gastroesophageal reflux disease)   . Morbid obesity [facility])     Past Surgical History  Procedure Laterality Date  . Tonsillectomy    . Shoulder surgery      right  . Colonoscopy       Family History    Problem Relation Age of Onset  . Breast cancer Mother   . Bone cancer Father     Social History: Lives alone.  Sedentary and furniture walked in the home and used cane as needed. He  reports that he has never smoked. He does not have any smokeless tobacco history on file. He reports that he drinks alcohol once a month.  He does not use illicit drugs.     Allergies  Allergen Reactions  . Bee Venom Hives and Itching     Medications Prior to Admission  Medication Sig Dispense Refill  . acetaminophen (TYLENOL) 500 MG tablet Take 1,000 mg by mouth every 6 (six) hours as needed for mild pain or headache.    . allopurinol (ZYLOPRIM) 300 MG tablet Take 300 mg by mouth every evening.    . cholecalciferol (VITAMIN D) 1000 units tablet Take 2,000 Units by mouth every evening.    Marland Kitchen lisinopril (PRINIVIL,ZESTRIL) 10 MG tablet Take 10 mg by mouth every evening.    Marland Kitchen lisinopril-hydrochlorothiazide (PRINZIDE,ZESTORETIC) 20-12.5 MG tablet Take 1 tablet by mouth every evening.    . pantoprazole (PROTONIX) 40 MG tablet Take 40 mg by mouth every evening.      Home: Home Living Family/patient expects to be discharged to:: Private residence Living Arrangements: Alone Available Help at Discharge: Family, Available PRN/intermittently Type of Home: House Home Access: Ramped entrance, Stairs to enter [employer] of Steps: at front Home Layout: Two level, Able to live on main level  with bedroom/bathroom Bathroom Shower/Tub: Engineer, manufacturing systemsTub/shower unit Bathroom Toilet: Standard Bathroom Accessibility: No Home Equipment: Cane - single point, Bankerhower seat  Functional History: Prior Function Level of Independence: Independent with assistive device(s) Comments: used cane occasionally; drove; completed ADL "as best he could" Functional Status:  Mobility: Bed Mobility Overal bed mobility: Needs Assistance Bed Mobility: Supine to Sit Supine to sit: Mod assist, +2 for safety/equipment General bed mobility  comments: OOB in chair Transfers Overall transfer level: Needs assistance Equipment used: Rolling walker (2 wheeled) Transfers: Sit to/from Stand, [employer] Pet[employer] CorporationStand Pivot Transfers Sit to Stand: +2 safety/equipment, Min guard Stand pivot transfers: Min assist General transfer comment: A with management of RW during pivot and cues for safe technique throughout transfer.   Ambulation/Gait Ambulation/Gait assistance: Min assist, +2 safety/equipment Ambulation Distance (Feet): 60 Feet Assistive device: Rolling walker (2 wheeled) Gait Pattern/deviations: Step-through pattern, Decreased stride length, Trunk flexed General Gait Details: pt moves quickly and needs cues throughout for pacing and positionging within RW.  pt on 5L O2 and sats decreased to 88% at lowest during ambulation.      ADL: ADL Overall ADL's : Needs assistance/impaired Grooming: Set up, Sitting Upper Body Bathing: Min guard, Supervision/ safety, Sitting Lower Body Bathing: Moderate assistance, Sit to/from stand Upper Body Dressing : Minimal assistance, Sitting Lower Body Dressing: Moderate assistance, Sit to/from stand Toilet Transfer: Min guard, BSC, Stand-pivot Toileting- Clothing Manipulation and Hygiene: Moderate assistance Functional mobility during ADLs: +2 for safety/equipment, Minimal assistance, Rolling walker General ADL Comments: Pt requires A or LB ADL. Fatigues easily. States pericare adn reaching feet were difficulty prior to admission due to body habitus. Would benefit from AE  Cognition: Cognition Overall Cognitive Status: Within Functional Limits for tasks assessed Orientation Level: Oriented X4 Cognition Arousal/Alertness: Awake/alert Behavior During Therapy: WFL for tasks assessed/performed Overall Cognitive Status: Within Functional Limits for tasks assessed Difficult to assess due to: Intubated    Blood pressure 122/79, pulse 74, temperature 98.6 F (37 C), temperature source Oral, resp. rate 17, height  5\' 9"  (1.753 m), weight 156 kg (343 lb 14.7 oz), SpO2 93 %. Physical Exam  Nursing note and vitals reviewed. Constitutional: He is oriented to person, place, and time. He appears well-developed and well-nourished. He is sleeping. Nasal cannula in place.  Increase WOB and wheezing with conversation. Morbidly obese  HENT:  Head: Normocephalic and atraumatic.  Mouth/Throat: Oropharynx is clear and moist.  Eyes: Conjunctivae are normal. Pupils are equal, round, and reactive to light.  Neck: Normal range of motion. Neck supple.  Cardiovascular: Normal rate and regular rhythm.   Respiratory: Effort normal. No respiratory distress. He has wheezes.  Intermittent productive cough.   GI: Soft. Bowel sounds are normal. He exhibits no distension. There is no tenderness.  Musculoskeletal: He exhibits no edema or tenderness.  Neurological: He is alert and oriented to person, place, and time. No cranial nerve deficit.  Reasonable insight and awareness. UE grossly 4/5 prox to distal. LE: 3+HF, 4ke and adfpf. No sensory findings.   Skin: Skin is warm and dry. No rash noted. No erythema.  Psychiatric: He has a normal mood and affect. His behavior is normal.    Results for orders placed or performed during the hospital encounter of 02/23/16 (from the past 24 hour(s))  Glucose, capillary     Status: Abnormal   Collection Time: 02/29/16 11:16 AM  Result Value Ref Range   Glucose-Capillary 107 (H) 65 - 99 mg/dL  Glucose, capillary     Status: None   Collection  Time: 02/29/16  3:33 PM  Result Value Ref Range   Glucose-Capillary 86 65 - 99 mg/dL  Glucose, capillary     Status: None   Collection Time: 02/29/16  7:35 PM  Result Value Ref Range   Glucose-Capillary 89 65 - 99 mg/dL  Glucose, capillary     Status: None   Collection Time: 02/29/16 11:39 PM  Result Value Ref Range   Glucose-Capillary 94 65 - 99 mg/dL  Basic metabolic panel     Status: Abnormal   Collection Time: 03/01/16  2:58 AM  Result  Value Ref Range   Sodium 143 135 - 145 mmol/L   Potassium 3.7 3.5 - 5.1 mmol/L   Chloride 99 (L) 101 - 111 mmol/L   CO2 31 22 - 32 mmol/L   Glucose, Bld 90 65 - 99 mg/dL   BUN 35 (H) 6 - 20 mg/dL   Creatinine, Ser 1.61 (H) 0.61 - 1.24 mg/dL   Calcium 9.0 8.9 - 09.6 mg/dL   GFR calc non Af Amer 58 (L) >60 mL/min   GFR calc Af Amer >60 >60 mL/min   Anion gap 13 5 - 15  Glucose, capillary     Status: None   Collection Time: 03/01/16  3:38 AM  Result Value Ref Range   Glucose-Capillary 82 65 - 99 mg/dL  Glucose, capillary     Status: None   Collection Time: 03/01/16  7:30 AM  Result Value Ref Range   Glucose-Capillary 86 65 - 99 mg/dL   Dg Chest Port 1 View  03/01/2016  CLINICAL DATA:  Community-acquired pneumonia, acute respiratory failure, intubated patient. EXAM: PORTABLE CHEST 1 VIEW COMPARISON:  Portable chest x-ray of February 29, 2016 FINDINGS: The lungs are reasonably well inflated allowing for the kyphosis and rotation present. There is patchy density laterally at the left lung base. The cardiac silhouette remains enlarged. The central pulmonary vascularity is engorged. There is no significant pleural effusion and no pneumothorax. The trachea and esophagus have been extubated. IMPRESSION: Stable cardiac enlargement and central pulmonary vascular congestion without significant pulmonary edema. Persistent left basilar atelectasis or pneumonia. Electronically Signed   By: David  [place] M.D.   On: 03/01/2016 07:29   Dg Chest Port 1 View  02/29/2016  CLINICAL DATA:  Intubated.  Pneumonia. EXAM: PORTABLE CHEST 1 VIEW COMPARISON:  02/27/2016 chest radiograph. FINDINGS: Endotracheal tube tip is 3.3 cm above the carina. Enteric tube enters the stomach, with the tip not seen on this image. Stable cardiomediastinal silhouette with top-normal heart size. No pneumothorax. No pleural effusion. Low lung volumes. Patchy bibasilar lung opacities, slightly increased on the left. Vascular crowding with no  overt pulmonary edema. IMPRESSION: 1. Well-positioned endotracheal and enteric tubes. 2. Low lung volumes with patchy bibasilar lung opacities, slightly increased on the left, favor atelectasis, cannot exclude a component of pneumonia. Electronically Signed   By: Delbert Phenix M.D.   On: 02/29/2016 09:36    Assessment/Plan: Diagnosis: debility secondary to respiratory failure and multiple medical issues 1. Does the need for close, 24 hr/day medical supervision in concert with the patient's rehab needs make it unreasonable for this patient to be served in a less intensive setting? Yes 2. Co-Morbidities requiring supervision/potential complications: htn, ckd, gout  3. Due to bladder management, bowel management, safety, skin/wound care, disease management, medication administration, pain management and patient education, does the patient require 24 hr/day rehab nursing? Yes 4. Does the patient require coordinated care of a physician, rehab nurse, PT (1-2 hrs/day, 5 days/week)  and OT (1-2 hrs/day, 5 days/week) to address physical and functional deficits in the context of the above medical diagnosis(es)? Yes Addressing deficits in the following areas: balance, endurance, locomotion, strength, transferring, bowel/bladder control, bathing, dressing, feeding, grooming, toileting and psychosocial support 5. Can the patient actively participate in an intensive therapy program of at least 3 hrs of therapy per day at least 5 days per week? Yes 6. The potential for patient to make measurable gains while on inpatient rehab is excellent 7. Anticipated functional outcomes upon discharge from inpatient rehab are modified independent  with PT, modified independent with OT, n/a with SLP. 8. Estimated rehab length of stay to reach the above functional goals is: 7-12 days 9. Does the patient have adequate social supports and living environment to accommodate these discharge functional goals? Yes and  Potentially 10. Anticipated D/C setting: Home 11. Anticipated post D/C treatments: HH therapy and Outpatient therapy 12. Overall Rehab/Functional Prognosis: excellent  RECOMMENDATIONS: This patient's condition is appropriate for continued rehabilitative care in the following setting: CIR Patient has agreed to participate in recommended program. Potentially Note that insurance prior authorization may be required for reimbursement for recommended care.  Comment: Rehab Admissions Coordinator to follow up.  Thanks,  Ranelle Oyster, MD, [place]     03/01/2016

## 2016-02-29 ENCOUNTER — Inpatient Hospital Stay (HOSPITAL_COMMUNITY): Payer: No Typology Code available for payment source

## 2016-02-29 LAB — CBC
HEMATOCRIT: 40.5 % (ref 39.0–52.0)
HEMOGLOBIN: 12.2 g/dL — AB (ref 13.0–17.0)
MCH: 26.3 pg (ref 26.0–34.0)
MCHC: 30.1 g/dL (ref 30.0–36.0)
MCV: 87.3 fL (ref 78.0–100.0)
Platelets: 239 10*3/uL (ref 150–400)
RBC: 4.64 MIL/uL (ref 4.22–5.81)
RDW: 16.4 % — ABNORMAL HIGH (ref 11.5–15.5)
WBC: 8.6 10*3/uL (ref 4.0–10.5)

## 2016-02-29 LAB — GLUCOSE, CAPILLARY
GLUCOSE-CAPILLARY: 89 mg/dL (ref 65–99)
GLUCOSE-CAPILLARY: 94 mg/dL (ref 65–99)
GLUCOSE-CAPILLARY: 98 mg/dL (ref 65–99)
Glucose-Capillary: 100 mg/dL — ABNORMAL HIGH (ref 65–99)
Glucose-Capillary: 97 mg/dL (ref 65–99)
Glucose-Capillary: 107 mg/dL — ABNORMAL HIGH (ref 65–99)
Glucose-Capillary: 86 mg/dL (ref 65–99)

## 2016-02-29 LAB — CULTURE, BLOOD (ROUTINE X 2)
Culture: NO GROWTH
Culture: NO GROWTH

## 2016-02-29 LAB — BASIC METABOLIC PANEL
ANION GAP: 12 (ref 5–15)
BUN: 34 mg/dL — ABNORMAL HIGH (ref 6–20)
CALCIUM: 9.3 mg/dL (ref 8.9–10.3)
CHLORIDE: 100 mmol/L — AB (ref 101–111)
CO2: 30 mmol/L (ref 22–32)
CREATININE: 1.07 mg/dL (ref 0.61–1.24)
GFR calc non Af Amer: >60 mL/min (ref >60)
Glucose, Bld: 98 mg/dL (ref 65–99)
Potassium: 3.7 mmol/L (ref 3.5–5.1)
SODIUM: 142 mmol/L (ref 135–145)

## 2016-02-29 LAB — MAGNESIUM: MAGNESIUM: 2.4 mg/dL (ref 1.7–2.4)

## 2016-02-29 LAB — PHOSPHORUS: PHOSPHORUS: 4.3 mg/dL (ref 2.5–4.6)

## 2016-02-29 MED ORDER — PANTOPRAZOLE SODIUM 40 MG IV SOLR
40.0000 mg | Freq: Every day | INTRAVENOUS | Status: DC
  Administered 2016-02-29: 40 mg via INTRAVENOUS
  Filled 2016-02-29: qty 40

## 2016-02-29 MED ORDER — CETYLPYRIDINIUM CHLORIDE 0.05 % MT LIQD
7.0000 mL | Freq: Two times a day (BID) | OROMUCOSAL | Status: DC
  Administered 2016-02-29 (×2): 7 mL via OROMUCOSAL

## 2016-02-29 MED ORDER — CHLORHEXIDINE GLUCONATE 0.12 % MT SOLN
15.0000 mL | Freq: Two times a day (BID) | OROMUCOSAL | Status: DC
  Administered 2016-02-29: 15 mL via OROMUCOSAL

## 2016-02-29 MED ORDER — FREE WATER
200.0000 mL | Status: DC
  Administered 2016-02-29: 200 mL

## 2016-02-29 MED ORDER — POTASSIUM CHLORIDE 20 MEQ/15ML (10%) PO SOLN
20.0000 meq | ORAL | Status: AC
  Administered 2016-02-29: 20 meq
  Filled 2016-02-29: qty 15

## 2016-02-29 NOTE — Progress Notes (Signed)
OT Cancellation Note  Patient Details Name: [name] MRN: [id number] DOB: 05/31/1951   Cancelled Treatment:    Reason Eval/Treat Not Completed: Patient at procedure or test/ unavailable. Attempted to see pt this am. Pt being extubated. Will return after lunch.   [facility]WARD,HILLARY  Brooklyne Radke, OTR/L  [phone] 02/29/2016 02/29/2016, 11:52 AM

## 2016-02-29 NOTE — Progress Notes (Signed)
PULMONARY / CRITICAL CARE MEDICINE   Name: Dylan Kennedy MRN: [id number] DOB: Feb 22, 1951    ADMISSION DATE:  02/23/2016  REFERRING MD:  [place]  CHIEF COMPLAINT:  Respiratory distress   HISTORY OF PRESENT ILLNESS:    65 yo with history of HTN, HLD, morbid obesity, gout who came in with 4 day history of shortness of breath, worse with exertion., subjective fevers, and intermittent left sided chest soreness. His O2 sats was 63% on 3L [place]. Was on nonrebreather, and CXR shows right middle lobe atelectasis- can not exclude pneumonia. Was started on IV fluids, ceftriaxone, and azithromycin. CT of chest showed multifocal pneumonia.  He was put on bipap and by the evening, pt had worsening acidosis and hypercarbia on his repeat ABG. Pt was then intubated.  Per Dr Nemiah Commander H&P, history there was obtained by his brother-in-law, who said he was able to ambulate with thehelp of a cane. Pt started having chills and resp symptoms for 4 days.   Subjective:  Peep down, fio2 down Neg again 1 liter  VITAL SIGNS: BP 125/61 mmHg  Pulse 72  Temp(Src) 98.6 F (37 C) (Oral)  Resp 20  Ht  (1.753 m)  Wt 151.048 kg (333 lb)  BMI 49.15 kg/m2  SpO2 97%  HEMODYNAMICS:    VENTILATOR SETTINGS: Vent Mode:  [-] PSV;CPAP FiO2 (%):  [40 %-50 %] 40 % Set Rate:  [20 bmp] 20 bmp Vt Set:  [450 mL] 450 mL PEEP:  [5 cmH20-8 cmH20] 5 cmH20 Pressure Support:  [5 cmH20-12 cmH20] 5 cmH20 Plateau Pressure:  [20 cmH20-25 cmH20] 25 cmH20  INTAKE / OUTPUT: I/O last 3 completed shifts: In: 2480 [NG/GT:2380; IV Piggyback:100] Out: 3870 [Urine:3870]  PHYSICAL EXAMINATION: General:  Intubated, alert, following commands Neuro:  Alert, opens eyes to commands Cardiovascular:  RRR, no m/r/g Lungs: Decreased but clear anterior Abdomen:  +BS, obese, non-tender Skin: Intertrigo  LABS:  BMET  Recent Labs Lab 02/27/16 0330 02/28/16 0505 02/29/16 0500  NA 146* 143 142  K 3.6 3.8 3.7  CL 103 103 100*   CO2 32 31 30  BUN 31* 32* 34*  CREATININE 1.10 1.06 1.07  GLUCOSE 99 99 98    Electrolytes  Recent Labs Lab 02/26/16 0515 02/27/16 0330 02/28/16 0505 02/29/16 0500  CALCIUM 8.4* 8.8* 9.3 9.3  MG 2.1 2.2  --  2.4  PHOS 3.8 3.7  --  4.3    CBC  Recent Labs Lab 02/26/16 0515 02/27/16 0330 02/29/16 0500  WBC 8.2 7.9 8.6  HGB 11.2* 11.2* 12.2*  HCT 39.0 38.5* 40.5  PLT 174 163 239    Coag's No results for input(s): APTT, INR in the last 168 hours.  Sepsis Markers  Recent Labs Lab 02/23/16 1339  LATICACIDVEN 1.4    ABG  Recent Labs Lab 02/24/16 0026 02/26/16 1324 02/28/16 0335  PHART 7.341* 7.257* 7.448  PCO2ART 56.3* 75.7* 44.8  PO2ART 86.0 64.0* 95.6    Liver Enzymes  Recent Labs Lab 02/23/16 1220  AST 29  ALT 18  ALKPHOS 77  BILITOT 1.1  ALBUMIN 3.9    Cardiac Enzymes  Recent Labs Lab 02/23/16 1220  TROPONINI 0.03    Glucose  Recent Labs Lab 02/28/16 1124 02/28/16 1523 02/28/16 1946 02/29/16 02/29/16 0320 02/29/16 0745  GLUCAP 103* 114* 100* 98 100* 97    Imaging No results found.   STUDIES:  CXR CT angio  CULTURES: Bcx 4/14 >> No growth RVP 4/15 >>  negative Trach aspirate 4/17 >> no  growth  ANTIBIOTICS: Vanc 4/14>> 4/14 Zosyn 4/14 > 4/14  Ceftriaxone 4/14 >plan 4/20 Azithro 4/14>plan 4/18  SIGNIFICANT EVENTS: Neg balance 1.4 liters 4/18  LINES/TUBES: ETT 4/14 > NG/OG tube 4/15 Foley 4/15 CVC RIJ 4/14 PIV x 2  DISCUSSION: 65 yo man with history of HTN, HLD, morbid obesity, gout and COPD. Admitted with resp failure, suspected CAP  ASSESSMENT / PLAN:  PULMONARY A: Acute hypoxic and hypercarbic respiratory failure secondary to multifocal pneumonia and possible OSA  R/o pulm edema on top likely some pickwickian / OHS P:   - pcxr now, assess volume status -weaning now cpap 5, ps 5, goal 1 hr, assess rsbi -neg balance goals remain, has improved weaning -upright position  CARDIOVASCULAR A:   HTN - currently pressures stable Possible cardiogenic pulmonary edema P:  -TTE w/ EF 65-70%, no wall motion abnormalities, normal PA pressure -lasix continued  RENAL A:   CKD 3 Creatine at baseline Gout  Pos balance P:    -Follow BMP - potassium supplement -excellent dose of lasix provided, maintain  GASTROINTESTINAL A:   GERD GI PPx Constipation - having BM's P:   -Protonix -Tube feeds, hold  As weaning well -Miralax  HEMATOLOGIC A:   dvt prevention P:  -Follow CBC -lovenox, crt fxn allows  INFECTIOUS A:   Probable CAP with bilateral infiltrates P:   -ceftriaxone stop date 4/20, allow to dc today  ENDOCRINE A:   No active issue P:   -monitor CBG q4h  RHEUMATOLOGIC A: Chronic Gout P: -steroids if exacerbation  NEUROLOGIC A:   Pt intubated/sedated but requiring neither P:   -fentanyl prn -versed prn  -RASS goal:0 Need in chair and walk   Ccm time 30 min   Mcarthur Rossettianiel J. Tyson AliasFeinstein, MD, FACP Pgr: [phone] [facility] 02/29/2016 8:54 AM

## 2016-02-29 NOTE — Care Management Note (Addendum)
Case Management Note  Patient Details  Name: Peyton BottomsJames Gorrell MRN: [id number] Date of Birth: 02/27/1951  Subjective/Objective:   Patient extubated today.  Lives at home alone and was independent prior.  Has a cane and shower chair.  States has lots of family that can assist him on discharge.  Talked about staying with a sister on discharge. PT recommending CIR.      Joylene IgoEILEEN W, RN @ [facility]: PHONE # [phone] FAX # [phone] IF PATIENT NEEDS: IP REHAB,LTAC OR SNF REQUIRE PERCERTIFICATION [facility] : PHONE  # [phone]  FAX # [phone]               Action/Plan:   Expected Discharge Date:                  Expected Discharge Plan:  Home/Self Care  In-House Referral:     Discharge planning Services  CM Consult  Post Acute Care Choice:    Choice offered to:     DME Arranged:    DME Agency:     HH Arranged:    HH Agency:     Status of Service:  In process, will continue to follow  Medicare Important Message Given:    Date Medicare IM Given:    Medicare IM give by:    Date Additional Medicare IM Given:    Additional Medicare Important Message give by:     If discussed at Long Length of Stay Meetings, dates discussed:    Additional Comments:  Vangie BickerBrown, Jailan Trimm Jane, RN 02/29/2016, 3:14 PM

## 2016-02-29 NOTE — Progress Notes (Signed)
Occupational Therapy Evaluation Patient Details Name: Dylan Kennedy MRN: [id number] DOB: 12/08/1950 Today's Date: 02/29/2016    History of Present Illness 65 yo with history of HTN, HLD, morbid obesity, gout who came in with 4 day history of shortness of breath, worse with exertion., subjective fevers, and intermittent left sided chest soreness. His O2 sats was 63% on 3L [place]. Was on nonrebreather, and CXR shows right middle lobe atelectasis- can not exclude pneumonia. . CT of chest showed multifocal pneumonia.  Failed on bipap so was intubated.   Clinical Impression   PTA, pt mod I with mobility (used cane occasionally) and ADL. Pt lived alone with his dog, and has his sister to assist as needed. Pt making excellent progress. Requires min A with mobility and mod A with LB ADL. Ambulated @ 100 ft @ RW level on 5L O2 with in A +2 for equipment/safety with 1 rest break. Desat to 88 but rebound to 94 in less than 1 min. Feel pt could benefit from CIR to reach mod I level to return home safely with intermittent S of family.     Follow Up Recommendations  CIR;Supervision/Assistance - 24 hour    Equipment Recommendations  3 in 1 bedside comode;Other (comment) (wide)    Recommendations for Other Services Rehab consult     Precautions / Restrictions Precautions Precautions: Fall Precaution Comments: watch O2 Sats Restrictions Weight Bearing Restrictions: No      Mobility Bed Mobility               General bed mobility comments: OOB in chair  Transfers Overall transfer level: Needs assistance Equipment used: Rolling walker (2 wheeled) Transfers: Sit to/from Stand;Stand Pivot Transfers Sit to Stand: +2 safety/equipment;Min guard Stand pivot transfers: Min assist            Balance Overall balance assessment: Needs assistance   Sitting balance-Leahy Scale: Good       Standing balance-Leahy Scale: Fair                              ADL Overall ADL's :  Needs assistance/impaired     Grooming: Set up;Sitting   Upper Body Bathing: Min guard;Supervision/ safety;Sitting   Lower Body Bathing: Moderate assistance;Sit to/from stand   Upper Body Dressing : Minimal assistance;Sitting   Lower Body Dressing: Moderate assistance;Sit to/from stand   Toilet Transfer: Min guard;BSC;Stand-pivot   Toileting- ArchitectClothing Manipulation and Hygiene: Moderate assistance       Functional mobility during ADLs: +2 for safety/equipment;Minimal assistance;Rolling walker General ADL Comments: Pt requires A or LB ADL. Fatigues easily. States pericare adn reaching feet were difficulty prior to admission due to body habitus. Would benefit from AE     Vision     Perception     Praxis      Pertinent Vitals/Pain Pain Assessment: Faces Faces Pain Scale: Hurts a little bit Pain Location: throat;bottom Pain Descriptors / Indicators: Aching;Sore Pain Intervention(s): Limited activity within patient's tolerance     Hand Dominance Right   Extremity/Trunk Assessment Upper Extremity Assessment Upper Extremity Assessment: Generalized weakness   Lower Extremity Assessment Lower Extremity Assessment: Defer to PT evaluation   Cervical / Trunk Assessment Cervical / Trunk Assessment: Normal   Communication Communication Communication: No difficulties   Cognition Arousal/Alertness: Awake/alert Behavior During Therapy: WFL for tasks assessed/performed Overall Cognitive Status: Within Functional Limits for tasks assessed  General Comments       Exercises       Shoulder Instructions      Home Living Family/patient expects to be discharged to:: Private residence Living Arrangements: Alone Available Help at Discharge: Family;Available PRN/intermittently Type of Home: House Home Access: Ramped entrance;Stairs to enter Entrance Stairs-Number of Steps: at front   Home Layout: Two level;Able to live on main level with  bedroom/bathroom     Bathroom Shower/Tub: Tub/shower unit Shower/tub characteristics: Engineer, building services: Standard Bathroom Accessibility: No   Home Equipment: Cane - single point;Shower seat          Prior Functioning/Environment Level of Independence: Independent with assistive device(s)        Comments: used cane occasionally; drove; completed ADL "as best he could"    OT Diagnosis: Generalized weakness   OT Problem List: Decreased strength;Decreased activity tolerance;Impaired balance (sitting and/or standing);Decreased safety awareness;Decreased knowledge of use of DME or AE;Cardiopulmonary status limiting activity;Obesity;Pain   OT Treatment/Interventions: Self-care/ADL training;Therapeutic exercise;Energy conservation;DME and/or AE instruction;Therapeutic activities;Patient/family education;Balance training    OT Goals(Current goals can be found in the care plan section) Acute Rehab OT Goals Patient Stated Goal: to return home OT Goal Formulation: With patient Time For Goal Achievement: 03/14/16 Potential to Achieve Goals: Good ADL Goals Pt Will Perform Lower Body Bathing: with modified independence;with adaptive equipment;sit to/from stand Pt Will Perform Lower Body Dressing: with modified independence;sit to/from stand;with adaptive equipment Pt Will Transfer to Toilet: with modified independence;ambulating;bedside commode Pt/caregiver will Perform Home Exercise Program: Both right and left upper extremity;Independently;With written HEP provided Additional ADL Goal #1: Pt will independently verbalize 3 energy conservation techniques for ADL  OT Frequency: Min 2X/week   Barriers to D/C:            Co-evaluation PT/OT/SLP Co-Evaluation/Treatment: Yes Reason for Co-Treatment: For patient/therapist safety   OT goals addressed during session: ADL's and self-care      End of Session Equipment Utilized During Treatment: Gait belt;Rolling walker;Oxygen  (5L) Nurse Communication: Mobility status  Activity Tolerance: Patient tolerated treatment well Patient left: in chair;with call bell/phone within reach   Time: 1330-1402 OT Time Calculation (min): 32 min Charges:  OT General Charges $OT Visit: 1 Procedure OT Evaluation $OT Eval Moderate Complexity: 1 Procedure G-Codes:    Leviathan Macera,HILLARY 2016/03/21, 2:28 PM   Arnie Maiolo, OTR/L  [phone] 03-21-2016

## 2016-02-29 NOTE — Progress Notes (Signed)
Physical Therapy Treatment Patient Details Name: Dylan Kennedy MRN: [id number] DOB: 1951/02/25 Today's Date: 02/29/2016    History of Present Illness 65 yo with history of HTN, HLD, morbid obesity, gout who came in with 4 day history of shortness of breath, worse with exertion., subjective fevers, and intermittent left sided chest soreness. His O2 sats was 63% on 3L [place]. Was on nonrebreather, and CXR shows right middle lobe atelectasis- can not exclude pneumonia. . CT of chest showed multifocal pneumonia.  Failed on bipap so was intubated 4/14 - 4/20.    PT Comments    Pt able to ambulate this session on 5L O2 with sats decreasing to 88% after ambulating 60'.  Pt needs cues for safety and pacing himself.  Continue to feel pt would benefit from CIR level of therapies to maximize independence prior to returning to home.  Will continue to follow.    Follow Up Recommendations  CIR     Equipment Recommendations  None recommended by PT    Recommendations for Other Services       Precautions / Restrictions Precautions Precautions: Fall Precaution Comments: watch O2 Sats Restrictions Weight Bearing Restrictions: No    Mobility  Bed Mobility               General bed mobility comments: OOB in chair  Transfers Overall transfer level: Needs assistance Equipment used: Rolling walker (2 wheeled) Transfers: Sit to/from Stand;Stand Pivot Transfers Sit to Stand: +2 safety/equipment;Min guard Stand pivot transfers: Min assist       General transfer comment: A with management of RW during pivot and cues for safe technique throughout transfer.    Ambulation/Gait Ambulation/Gait assistance: Min assist;+2 safety/equipment Ambulation Distance (Feet): 60 Feet Assistive device: Rolling walker (2 wheeled) Gait Pattern/deviations: Step-through pattern;Decreased stride length;Trunk flexed     General Gait Details: pt moves quickly and needs cues throughout for pacing and positionging  within RW.  pt on 5L O2 and sats decreased to 88% at lowest during ambulation.     Stairs            Wheelchair Mobility    Modified Rankin (Stroke Patients Only)       Balance Overall balance assessment: Needs assistance Sitting-balance support: No upper extremity supported;Feet supported Sitting balance-Leahy Scale: Good     Standing balance support: Single extremity supported;Bilateral upper extremity supported;During functional activity Standing balance-Leahy Scale: Fair                      Cognition Arousal/Alertness: Awake/alert Behavior During Therapy: WFL for tasks assessed/performed Overall Cognitive Status: Within Functional Limits for tasks assessed                      Exercises      General Comments        Pertinent Vitals/Pain Pain Assessment: Faces Faces Pain Scale: Hurts a little bit Pain Location: Throat and Bottom Pain Descriptors / Indicators: Aching;Sore Pain Intervention(s): Limited activity within patient's tolerance;Monitored during session;Repositioned    Home Living Family/patient expects to be discharged to:: Private residence Living Arrangements: Alone Available Help at Discharge: Family;Available PRN/intermittently Type of Home: House Home Access: Ramped entrance;Stairs to enter   Home Layout: Two level;Able to live on main level with bedroom/bathroom Home Equipment: Gilmer Mor - single point;Shower seat      Prior Function Level of Independence: Independent with assistive device(s)      Comments: used cane occasionally; drove; completed ADL "as best he could"  PT Goals (current goals can now be found in the care plan section) Acute Rehab PT Goals Patient Stated Goal: to return home PT Goal Formulation: Patient unable to participate in goal setting Time For Goal Achievement: 03/12/16 Potential to Achieve Goals: Good Progress towards PT goals: Progressing toward goals    Frequency  Min 3X/week    PT Plan  Current plan remains appropriate    Co-evaluation PT/OT/SLP Co-Evaluation/Treatment: Yes Reason for Co-Treatment: For patient/therapist safety PT goals addressed during session: Mobility/safety with mobility;Balance;Proper use of DME OT goals addressed during session: ADL's and self-care     End of Session Equipment Utilized During Treatment: Gait belt;Oxygen Activity Tolerance: Patient tolerated treatment well Patient left: in chair;with call bell/phone within reach     Time: 1330-1402 PT Time Calculation (min) (ACUTE ONLY): 32 min  Charges:  $Gait Training: 8-22 mins                    G CodesSunny Schlein:      [name], South CarolinaPT [phone] 02/29/2016, 2:37 PM

## 2016-02-29 NOTE — Progress Notes (Signed)
Nutrition Follow-up  DOCUMENTATION CODES:   Morbid obesity  INTERVENTION:    Diet advancement as able post extubation.   NUTRITION DIAGNOSIS:   Inadequate oral intake related to inability to eat as evidenced by NPO status.  Ongoing  GOAL:   Patient will meet greater than or equal to 90% of their needs  Unmet  MONITOR:   Diet advancement, PO intake, Skin, I & O's, Labs, Weight trends  ASSESSMENT:   65 yo with history of HTN, HLD, morbid obesity, gout who came in with 4 day history of shortness of breath, worse with exertion., subjective fevers, and intermittent left sided chest soreness. His O2 sats was 63% on 3L [place]. Was on nonrebreather, and CXR shows right middle lobe atelectasis- can not exclude pneumonia. Was started on IV fluids, ceftriaxone, and azithromycin. CT of chest showed multifocal pneumonia. He was put on bipap and by the evening, pt had worsening acidosis and hypercarbia on his repeat ABG. Pt was then intubated.   Discussed patient in ICU rounds and with RN today. Patient was extubated this morning. He remains NPO. TF off. Labs and medications reviewed. Hopeful for diet advancement soon.   Diet Order:  Diet NPO time specified  Skin:  Reviewed, no issues  Last BM:  4/19  Height:   Ht Readings from Last 1 Encounters:  02/24/16 5\' 9"  (1.753 m)    Weight:   Wt Readings from Last 1 Encounters:  02/27/16 333 lb (151.048 kg)    Ideal Body Weight:  72.73 kg (kg)  BMI:  Body mass index is 49.15 kg/(m^2).  Estimated Nutritional Needs:   Kcal:  2300-2500  Protein:  125-145 gm  Fluid:  2.5 L  EDUCATION NEEDS:   No education needs identified at this time  Joaquin CourtsKimberly Jun Rightmyer, RD, LDN, CNSC Pager [phone] After Hours Pager [phone]

## 2016-02-29 NOTE — Progress Notes (Signed)
Placed patinet back on nasal cannula due to intolerance. Patient has increased sputum production and not tolerating BiPAP at this time. BiPAP is at bedside. RT will continue to monitor.

## 2016-02-29 NOTE — Progress Notes (Signed)
Placed patient on BiPAP for the night. Patient is tolerating it well and RT will continue to monitor.

## 2016-02-29 NOTE — Procedures (Signed)
Extubation Procedure Note  Patient Details:   Name: Dylan Kennedy DOB: 11/24/1950 MRN: [id number]   Airway Documentation:  Airway 8 mm (Active)  Secured at (cm) 23 cm 02/29/2016  7:20 AM  Measured From Lips 02/29/2016  7:20 AM  Secured Location Left 02/29/2016  7:20 AM  Secured By [employer] FargoCommercial Tube Holder 02/29/2016  7:20 AM  Tube Holder Repositioned Yes 02/29/2016  7:20 AM  Cuff Pressure (cm H2O) 24 cm H2O 02/29/2016  7:20 AM  Site Condition Dry 02/29/2016  7:20 AM    Evaluation  O2 sats: stable throughout Complications: No apparent complications Patient did tolerate procedure well. Bilateral Breath Sounds: Diminished   Yes   Extubated to 4 lpm [place] strong productive cough.  Tolerated well able to speak full name and location.  Sat 95% [place] CampbellHall, Ramandeep Arington Lynn 02/29/2016, 9:56 AM

## 2016-03-01 ENCOUNTER — Inpatient Hospital Stay (HOSPITAL_COMMUNITY): Payer: No Typology Code available for payment source

## 2016-03-01 DIAGNOSIS — R5381 Other malaise: Secondary | ICD-10-CM

## 2016-03-01 LAB — BASIC METABOLIC PANEL
Anion gap: 13 (ref 5–15)
BUN: 35 mg/dL — AB (ref 6–20)
CHLORIDE: 99 mmol/L — AB (ref 101–111)
CO2: 31 mmol/L (ref 22–32)
Calcium: 9 mg/dL (ref 8.9–10.3)
Creatinine, Ser: 1.28 mg/dL — ABNORMAL HIGH (ref 0.61–1.24)
GFR calc Af Amer: >60 mL/min (ref >60)
GFR calc non Af Amer: 58 mL/min — ABNORMAL LOW (ref >60)
Glucose, Bld: 90 mg/dL (ref 65–99)
POTASSIUM: 3.7 mmol/L (ref 3.5–5.1)
SODIUM: 143 mmol/L (ref 135–145)

## 2016-03-01 LAB — GLUCOSE, CAPILLARY
GLUCOSE-CAPILLARY: 86 mg/dL (ref 65–99)
GLUCOSE-CAPILLARY: 91 mg/dL (ref 65–99)
Glucose-Capillary: 82 mg/dL (ref 65–99)
Glucose-Capillary: 103 mg/dL — ABNORMAL HIGH (ref 65–99)
Glucose-Capillary: 119 mg/dL — ABNORMAL HIGH (ref 65–99)

## 2016-03-01 MED ORDER — FUROSEMIDE 10 MG/ML IJ SOLN: 20.0000 mg | Freq: Every day | INTRAMUSCULAR | Status: DC

## 2016-03-01 MED ORDER — PANTOPRAZOLE SODIUM 40 MG PO TBEC
40.0000 mg | DELAYED_RELEASE_TABLET | Freq: Every day | ORAL | Status: DC
  Administered 2016-03-02 – 2016-03-07 (×6): 40 mg via ORAL
  Filled 2016-03-01 (×6): qty 1

## 2016-03-01 NOTE — Progress Notes (Signed)
PULMONARY / CRITICAL CARE MEDICINE   Name: Dylan BottomsJames Kennedy MRN: [id number] DOB: 04/07/1951    ADMISSION DATE:  02/23/2016  REFERRING MD:  Cornelius  CHIEF COMPLAINT:  Respiratory distress   HISTORY OF PRESENT ILLNESS:    65 yo with history of HTN, HLD, morbid obesity, gout who came in with 4 day history of shortness of breath, worse with exertion., subjective fevers, and intermittent left sided chest soreness. His O2 sats was 63% on 3L [place]. Was on nonrebreather, and CXR shows right middle lobe atelectasis- can not exclude pneumonia. Was started on IV fluids, ceftriaxone, and azithromycin. CT of chest showed multifocal pneumonia.  He was put on bipap and by the evening, pt had worsening acidosis and hypercarbia on his repeat ABG. Pt was then intubated.  Per Dr Nemiah CommanderKalisetti H&P, history there was obtained by his brother-in-law, who said he was able to ambulate with thehelp of a cane. Pt started having chills and resp symptoms for 4 days.   Subjective:   No acute events overnight. Pt doing well and he has been extubated. Was put on bipap at night.  VITAL SIGNS: BP 122/79 mmHg  Pulse 74  Temp(Src) 98.6 F (37 C) (Oral)  Resp 17  Ht 5\' 9"  (1.753 m)  Wt 343 lb 14.7 oz (156 kg)  BMI 50.76 kg/m2  SpO2 93%  HEMODYNAMICS:    VENTILATOR SETTINGS:   INTAKE / OUTPUT: I/O last 3 completed shifts: In: 1821 [I.V.:46; NG/GT:1725; IV Piggyback:50] Out: 4225 [Urine:4225]  PHYSICAL EXAMINATION: General:  extubated, alert, following commands Neuro:  Alert, opens eyes to commands Cardiovascular:  RRR, no m/r/g Lungs: Decreased but clear anterior Abdomen:  +BS, obese, non-tender Skin: Intertrigo  LABS:  BMET  Recent Labs Lab 02/28/16 0505 02/29/16 0500 03/01/16 0258  NA 143 142 143  K 3.8 3.7 3.7  CL 103 100* 99*  CO2 31 30 31   BUN 32* 34* 35*  CREATININE 1.06 1.07 1.28*  GLUCOSE 99 98 90    Electrolytes  Recent Labs Lab 02/26/16 0515 02/27/16 0330 02/28/16 0505  02/29/16 0500 03/01/16 0258  CALCIUM 8.4* 8.8* 9.3 9.3 9.0  MG 2.1 2.2  --  2.4  --   PHOS 3.8 3.7  --  4.3  --     CBC  Recent Labs Lab 02/26/16 0515 02/27/16 0330 02/29/16 0500  WBC 8.2 7.9 8.6  HGB 11.2* 11.2* 12.2*  HCT 39.0 38.5* 40.5  PLT 174 163 239    Coag's No results for input(s): APTT, INR in the last 168 hours.  Sepsis Markers  Recent Labs Lab 02/23/16 1339  LATICACIDVEN 1.4    ABG  Recent Labs Lab 02/24/16 0026 02/26/16 1324 02/28/16 0335  PHART 7.341* 7.257* 7.448  PCO2ART 56.3* 75.7* 44.8  PO2ART 86.0 64.0* 95.6    Liver Enzymes  Recent Labs Lab 02/23/16 1220  AST 29  ALT 18  ALKPHOS 77  BILITOT 1.1  ALBUMIN 3.9    Cardiac Enzymes  Recent Labs Lab 02/23/16 1220  TROPONINI 0.03    Glucose  Recent Labs Lab 02/29/16 0745 02/29/16 1116 02/29/16 1533 02/29/16 1935 02/29/16 2339 03/01/16 0338  GLUCAP 97 107* 86 89 94 82    Imaging Dg Chest Port 1 View  03/01/2016  CLINICAL DATA:  Community-acquired pneumonia, acute respiratory failure, intubated patient. EXAM: PORTABLE CHEST 1 VIEW COMPARISON:  Portable chest x-ray of February 29, 2016 FINDINGS: The lungs are reasonably well inflated allowing for the kyphosis and rotation present. There is patchy density laterally at  the left lung base. The cardiac silhouette remains enlarged. The central pulmonary vascularity is engorged. There is no significant pleural effusion and no pneumothorax. The trachea and esophagus have been extubated. IMPRESSION: Stable cardiac enlargement and central pulmonary vascular congestion without significant pulmonary edema. Persistent left basilar atelectasis or pneumonia. Electronically Signed   By: David  [place] M.D.   On: 03/01/2016 07:29   Dg Chest Port 1 View  02/29/2016  CLINICAL DATA:  Intubated.  Pneumonia. EXAM: PORTABLE CHEST 1 VIEW COMPARISON:  02/27/2016 chest radiograph. FINDINGS: Endotracheal tube tip is 3.3 cm above the carina. Enteric tube  enters the stomach, with the tip not seen on this image. Stable cardiomediastinal silhouette with top-normal heart size. No pneumothorax. No pleural effusion. Low lung volumes. Patchy bibasilar lung opacities, slightly increased on the left. Vascular crowding with no overt pulmonary edema. IMPRESSION: 1. Well-positioned endotracheal and enteric tubes. 2. Low lung volumes with patchy bibasilar lung opacities, slightly increased on the left, favor atelectasis, cannot exclude a component of pneumonia. Electronically Signed   By: Delbert Phenix M.D.   On: 02/29/2016 09:36     STUDIES:  CXR CT angio  CULTURES: Bcx 4/14 >> No growth RVP 4/15 >>  negative Trach aspirate 4/17 >> no growth  ANTIBIOTICS: Vanc 4/14>> 4/14 Zosyn 4/14 > 4/14  Ceftriaxone 4/14 >plan 4/20 Azithro 4/14>plan 4/18  SIGNIFICANT EVENTS: Neg balance 2.1 liters 4/20  LINES/TUBES: ETT 4/14 >>>4/20 NG/OG tube 4/15 Foley 4/15>>>4/21 CVC RIJ 4/14>>>4/20 PIV x 2  DISCUSSION: 65 yo man with history of HTN, HLD, morbid obesity, gout and COPD. Admitted with resp failure, suspected CAP  ASSESSMENT / PLAN:  PULMONARY A: Acute hypoxic and hypercarbic respiratory failure secondary to multifocal pneumonia and possible OSA  R/o pulm edema on top likely some pickwickian / OHS P:   -IS -upright position -nocturnal BIPAP is needed, low settings as may not have tolerated  -even balance  CARDIOVASCULAR A:  HTN - currently pressures stable Possible cardiogenic pulmonary edema P:  -TTE w/ EF 65-70%, no wall motion abnormalities, normal PA pressure -lasix see renal  RENAL A:   CKD 3 Creatine bump to 1.28 Gout  overdiuresis coming? P:    -Follow BMP -decrease lasix  GASTROINTESTINAL A:   GERD GI PPx Constipation - having BM's P:   -Protonix, maintain  -advance diet as tolerated -Miralax, keep , may need escalation  HEMATOLOGIC A:   dvt prevention not needed today P:  -Follow CBC -lovenox, dc as  walking  INFECTIOUS A:   Probable CAP with bilateral infiltrates P:   -ceftriaxone stop date 4/20, allow to dc today  ENDOCRINE A:   No active issue P:   -monitor CBG q4h  RHEUMATOLOGIC A: Chronic Gout P: -steroids if exacerbation  NEUROLOGIC A:   Pt has been extubated P:   -ambulate -PT   Deneise Lever, MD PGY 1 Internal Medicine   [facility] 03/01/2016 8:01 AM  STAFF NOTE: Cindi Carbon, MD FACP have personally reviewed patient's available data, including medical history, events of note, physical examination and test results as part of my evaluation. I have discussed with resident/NP and other care providers such as pharmacist, RN and RRT. In addition, I personally evaluated patient and elicited key findings of: in chair, lungs sounds improved, pcxr rotated film, crt slight rise, lasix reduction, NEeds BIPAP to home and then study outpt sleep study, chem in am , dc dvt prev as he is walking, to med floor, to triad, dc  foley, updated pt  Mcarthur Rossetti. Tyson Alias, MD, FACP Pgr: [phone] [facility] 03/01/2016 11:05 AM

## 2016-03-01 NOTE — Care Management Note (Signed)
Case Management Note  Patient Details  Name: Dylan Kennedy MRN: [id number] Date of Birth: 11/27/1950  Subjective/Objective:   Patient extubated today.  Lives at home alone and was independent prior.  Has a cane and shower chair.  States has lots of family that can assist him on discharge.  Talked about staying with a sister on discharge. PT recommending CIR.      Joylene IgoEILEEN W, RN @ [facility]: PHONE # [phone] FAX # [phone] IF PATIENT NEEDS: IP REHAB,LTAC OR SNF REQUIRE PERCERTIFICATION [facility] : PHONE  # [phone]  FAX # [phone]               Action/Plan:   Expected Discharge Date:                  Expected Discharge Plan:  Home/Self Care  In-House Referral:     Discharge planning Services  CM Consult  Post Acute Care Choice:    Choice offered to:     DME Arranged:    DME Agency:     HH Arranged:    HH Agency:     Status of Service:  In process, will continue to follow  Medicare Important Message Given:    Date Medicare IM Given:    Medicare IM give by:    Date Additional Medicare IM Given:    Additional Medicare Important Message give by:     If discussed at Long Length of Stay Meetings, dates discussed:    Additional Comments: Sister states cannot care for patient on discharge.  SW consult placed.  Will need bipap at night on discharge.  Dylan Kennedy, Dylan Pytel Jane, RN 03/01/2016, 11:38 AM

## 2016-03-01 NOTE — Evaluation (Signed)
Clinical/Bedside Swallow Evaluation Patient Details  Name: Dylan Kennedy MRN: [id number] Date of Birth: 1951/07/15  Today's Date: 03/01/2016 Time: SLP Start Time (ACUTE ONLY): 0947 SLP Stop Time (ACUTE ONLY): 1004 SLP Time Calculation (min) (ACUTE ONLY): 17 min  Past Medical History:  Past Medical History  Diagnosis Date  . Hypertension   . Gout   . GERD (gastroesophageal reflux disease)   . Morbid obesity (HCC)    Past Surgical History:  Past Surgical History  Procedure Laterality Date  . Tonsillectomy    . Shoulder surgery      right  . Colonoscopy     HPI:  65 yo with history of HTN, HLD, morbid obesity, gout who came in with 4 day history of shortness of breath, worse with exertion., subjective fevers, and intermittent left sided chest soreness. His O2 sats was 63% on 3L [place]. Was on nonrebreather, and CXR shows right middle lobe atelectasis- can not exclude pneumonia. . CT of chest showed multifocal pneumonia. Failed on bipap so was intubated 4/14-4/20.   Assessment / Plan / Recommendation Clinical Impression  Pt's voicing and cough is strong after prolonged intubation. He has no overt signs of aspiration or difficulty with thin liquids, although trials of applesauce and soft solids result in multiple swallows and delayed, dry cough x1. Pt reports feeling like the foods are "stuck" in his throat, with the sensation not alleviated by additional liquid washes. Pt does share a h/o reflux and additional suspected esophageal issues, as he describes having to regurgitate foods. SLP provided Mod cues for use of esopahgeal precautions during intake. Suspect that pt's signs of dysphagia are primarily related to GI history, although likely impacted by prolonged intubation to some extent. Recommend to start Dys 2 diet and thin liquids with strict use of aspiration and reflux precautions. Will continue to monitor for advancement versus need for objective testing.    Aspiration Risk  Mild  aspiration risk;Moderate aspiration risk    Diet Recommendation Dysphagia 2 (Fine chop);Thin liquid   Liquid Administration via: Cup;No straw Medication Administration: Crushed with puree Supervision: Patient able to self feed;Full supervision/cueing for compensatory strategies Compensations: Slow rate;Small sips/bites;Follow solids with liquid Postural Changes: Seated upright at 90 degrees;Remain upright for at least 30 minutes after po intake    Other  Recommendations Oral Care Recommendations: Oral care BID   Follow up Recommendations  Inpatient Rehab    Frequency and Duration min 2x/week  2 weeks       Prognosis Prognosis for Safe Diet Advancement: Good      Swallow Study   General HPI: 65 yo with history of HTN, HLD, morbid obesity, gout who came in with 4 day history of shortness of breath, worse with exertion., subjective fevers, and intermittent left sided chest soreness. His O2 sats was 63% on 3L [place]. Was on nonrebreather, and CXR shows right middle lobe atelectasis- can not exclude pneumonia. . CT of chest showed multifocal pneumonia. Failed on bipap so was intubated 4/14-4/20. Type of Study: Bedside Swallow Evaluation Previous Swallow Assessment: none in chart Diet Prior to this Study: NPO Temperature Spikes Noted: No Respiratory Status: Nasal cannula History of Recent Intubation: Yes Length of Intubations (days): 7 days Date extubated: 02/29/16 Behavior/Cognition: Alert;Cooperative;Pleasant mood Oral Cavity Assessment: Dried secretions Oral Care Completed by SLP: No Oral Cavity - Dentition: Adequate natural dentition Vision: Functional for self-feeding Self-Feeding Abilities: Able to feed self Patient Positioning: Upright in bed Baseline Vocal Quality: Normal Volitional Cough: Strong Volitional Swallow: Able to elicit  Oral/Motor/Sensory Function Overall Oral Motor/Sensory Function: Within functional limits   Ice Chips Ice chips: Within functional  limits Presentation: Spoon   Thin Liquid Thin Liquid: Within functional limits Presentation: Cup;Self Fed;Straw    Nectar Thick Nectar Thick Liquid: Not tested   Honey Thick Honey Thick Liquid: Not tested   Puree Puree: Impaired Presentation: Self Fed;Spoon Pharyngeal Phase Impairments: Multiple swallows;Cough - Delayed   Solid   GO   Solid: Impaired Presentation: Self Fed Pharyngeal Phase Impairments: Multiple swallows       Dylan Kennedy, M.A. CCC-SLP [phone]  Dylan Kennedy, Dylan Kennedy 03/01/2016,10:34 AM

## 2016-03-01 NOTE — Progress Notes (Signed)
Inpatient Rehabilitation  Met with patient to discuss team's recommendation for IP Rehab.  Patient verbalized poor insight into deficits verbalizing that he needed to/wanted to go home first.  Discussed process with patient and shared rehab booklets.  Patient agreed to pursue IP Rehab admission if he still needs it on Monday.  I have initiated insurance authorization; appreciate RN CM note with pre-authorization number.  Plan to have my co-worker Gerlean Ren follow up Monday, 03/04/16.  Carmelia Roller., CCC/SLP Admission Coordinator  [place] [facility]  Cell [phone]

## 2016-03-01 NOTE — Progress Notes (Signed)
Patient resting comfortably on nasal cannula.  Bipap not needed at this time.  Will continue to monitor.  

## 2016-03-01 NOTE — Progress Notes (Signed)
Pt's sister Asher MuirJamie notified of pt's transfer to [phone]

## 2016-03-02 DIAGNOSIS — I10 Essential (primary) hypertension: Secondary | ICD-10-CM

## 2016-03-02 DIAGNOSIS — Z6841 Body Mass Index (BMI) 40.0 and over, adult: Secondary | ICD-10-CM

## 2016-03-02 LAB — GLUCOSE, CAPILLARY
GLUCOSE-CAPILLARY: 100 mg/dL — AB (ref 65–99)
Glucose-Capillary: 91 mg/dL (ref 65–99)
Glucose-Capillary: 104 mg/dL — ABNORMAL HIGH (ref 65–99)
Glucose-Capillary: 130 mg/dL — ABNORMAL HIGH (ref 65–99)

## 2016-03-02 LAB — BASIC METABOLIC PANEL
Anion gap: 12 (ref 5–15)
BUN: 37 mg/dL — AB (ref 6–20)
CHLORIDE: 94 mmol/L — AB (ref 101–111)
CO2: 34 mmol/L — AB (ref 22–32)
CREATININE: 1.41 mg/dL — AB (ref 0.61–1.24)
Calcium: 9 mg/dL (ref 8.9–10.3)
GFR calc Af Amer: 60 mL/min — ABNORMAL LOW (ref >60)
GFR calc non Af Amer: 52 mL/min — ABNORMAL LOW (ref >60)
GLUCOSE: 94 mg/dL (ref 65–99)
POTASSIUM: 3.8 mmol/L (ref 3.5–5.1)
Sodium: 140 mmol/L (ref 135–145)

## 2016-03-02 MED ORDER — SODIUM CHLORIDE 0.9% FLUSH: 10.0000 mL | INTRAVENOUS | Status: DC | PRN

## 2016-03-02 NOTE — Progress Notes (Signed)
Speech Language Pathology Treatment: Dysphagia  Patient Details Name: Dylan BottomsJames Zumstein MRN: [id number] DOB: 12/13/1950 Today's Date: 03/02/2016 Time: [id number] SLP Time Calculation (min) (ACUTE ONLY): 20 min  Assessment / Plan / Recommendation Clinical Impression  Pt given minimal verbal cues for small sips while consuming Dysphagia 2/thin diet during observation of lunch tray with one incidence of delayed cough throughout meal and pt stated "sometimes I get something hung in the back of my throat and I have to clear it with water." He also stated he was on medication for GERD at present; consumed Dyphagia 2/thin diet without overt s/s of aspiration noted; continue current POC and possible upgrade with ST f/u during hospitalization.   HPI HPI: 65 yo with history of HTN, HLD, morbid obesity, gout who came in with 4 day history of shortness of breath, worse with exertion., subjective fevers, and intermittent left sided chest soreness. His O2 sats was 63% on 3L [place]. Was on nonrebreather, and CXR shows right middle lobe atelectasis- can not exclude pneumonia. . CT of chest showed multifocal pneumonia. Failed on bipap so was intubated 4/14-4/20.      SLP Plan  Continue with current plan of care     Recommendations  Diet recommendations: Dysphagia 2 (fine chop);Thin liquid Liquids provided via: Cup;No straw Medication Administration: Crushed with puree Supervision: Patient able to self feed Compensations: Slow rate;Small sips/bites             Oral Care Recommendations: Oral care BID Follow up Recommendations: Inpatient Rehab Plan: Continue with current plan of care                    ADAMS,PAT, M.S., CCC-SLP 03/02/2016, 2:15 PM

## 2016-03-02 NOTE — Progress Notes (Signed)
Progress Note    Dylan Kennedy  ZOX:[id number] DOB: Jan 06, 1951  DOA: 02/23/2016 PCP: No primary care provider on file.   Outpatient Specialists:   None.   Brief Narrative:   Dylan Kennedy is an 65 y.o. male with a PMH of hypertension, hyperlipidemia, morbid obesity and gout who was admitted by the critical care team on 02/23/16 with acute hypoxic and hypercarbic respiratory failure secondary to multifocal pneumonia requiring ventilatory support/intubation from 02/23/16-02/29/16.  Assessment/Plan:   Principal Problem:   Acute hypercapnic respiratory failure (HCC) secondary to multifocal pneumonia Intubated from 02/23/16-02/29/16. He used to require nocturnal BiPAP as needed. Has completed a course of antibiotics with Rocephin/azithromycin. Blood and sputum cultures were negative. Respiratory virus panel was negative. Continue pulmonary toilet. Will need home BiPAP set up an outpatient and sleep study arranged.  Active Problems:   CKD (chronic kidney disease) stage 3, GFR 30-59 ml/min Creatinine slightly worse, thought to be secondary to overdiuresis. Lasix was decreased yesterday, would hold today. Continue to monitor.    Hypokalemia Repleted.    Morbid obesity with BMI of 40.0-44.9, adult    Essential hypertension Hold Lasix. TTE w/ EF 65-70%, no wall motion abnormalities, normal PA pressure.    Candidal intertrigo Continue nystatin topical powder.  Family Communication/Anticipated D/C date and plan/Code Status   DVT prophylaxis: Ambulatory. Code Status: Full Code.  Family Communication: Sister and niece at bedside. Disposition Plan: CIR pending insurance approval and patient compliance.   Medical Consultants:    Pulmonology  Physical Medicine and Rehabilitation   Procedures:   2-D echo  Anti-Infectives:   Vanc 4/14>> 4/14 Zosyn 4/14 > 4/14  Ceftriaxone 4/14 >4/20 Azithro 4/14>4/18  Subjective:   Dylan Kennedy denies current dyspnea.  No  complaints of pain.  No nausea or vomiting.  Sitting up in chair with family at the bedside, who note that he can be impulsive with wanting to get up without assistance.  Objective:    Filed Vitals:   03/01/16 2000 03/01/16 2041 03/01/16 2349 03/02/16 0519  BP: 132/71 121/87  126/71  Pulse: 85 84 88 72  Temp:  98.5 F (36.9 C)  98.5 F (36.9 C)  TempSrc:  Oral  Axillary  Resp: Height:   (1.753 m)    Weight:  135.762 kg (299 lb 4.8 oz)    SpO2: 95% 94% 94% 98%    Intake/Output Summary (Last 24 hours) at 03/02/16 0846 Last data filed at 03/02/16 0519  Gross per 24 hour  Intake    220 ml  Output   1065 ml  Net   -845 ml   Filed Weights   02/27/16 0426 03/01/16 0425 03/01/16 2041  Weight: 151.048 kg (333 lb) 156 kg (343 lb 14.7 oz) 135.762 kg (299 lb 4.8 oz)    Exam: General exam: Appears calm and comfortable. Sitting up in chair with oxygen on. Respiratory system: Clear to auscultation but diminished. Respiratory effort normal. Cardiovascular system: S1 & S2 heard, RRR. No JVD, murmurs, rubs, gallops or clicks. Trace pedal edema. Gastrointestinal system: Abdomen is nondistended, obese and nontender. No organomegaly or masses felt. Normal bowel sounds heard. Central nervous system: Alert and oriented. No focal neurological deficits. Extremities: Symmetric 5 x 5 power. No clubbing, or cyanosis. Skin: No rashes, lesions or ulcers Psychiatry: Judgement and insight appear normal. Mood & affect flat.   Data Reviewed:   I have personally reviewed following labs and imaging studies:  Labs: Basic Metabolic Panel:  Recent Labs Lab 02/26/16 0515 02/27/16 0330 02/28/16 0505 02/29/16 0500 03/01/16 0258 03/02/16 0523  NA 145 146* 143 142 143 140  K 3.6 3.6 3.8 3.7 3.7 3.8  CL 105 103 103 100* 99* 94*  CO2 30 32 31 30 31  34*  GLUCOSE 93 99 99 98 90 94  BUN 28* 31* 32* 34* 35* 37*  CREATININE 1.18 1.10 1.06 1.07 1.28* 1.41*  CALCIUM 8.4* 8.8* 9.3 9.3 9.0  9.0  MG 2.1 2.2  --  2.4  --   --   PHOS 3.8 3.7  --  4.3  --   --    GFR Estimated Creatinine Clearance: 73.3 mL/min (by C-G formula based on Cr of 1.41). Liver Function Tests: No results for input(s): AST, ALT, ALKPHOS, BILITOT, PROT, ALBUMIN in the last 168 hours. No results for input(s): LIPASE, AMYLASE in the last 168 hours. No results for input(s): AMMONIA in the last 168 hours. Coagulation profile No results for input(s): INR, PROTIME in the last 168 hours.  CBC:  Recent Labs Lab 02/25/16 0530 02/26/16 0515 02/27/16 0330 02/29/16 0500  WBC 8.4 8.2 7.9 8.6  HGB 11.7* 11.2* 11.2* 12.2*  HCT 39.5 39.0 38.5* 40.5  MCV 89.8 90.5 90.0 87.3  PLT 171 174 163 239   Cardiac Enzymes: No results for input(s): CKTOTAL, CKMB, CKMBINDEX, TROPONINI in the last 168 hours. BNP (last 3 results) No results for input(s): PROBNP in the last 8760 hours. CBG:  Recent Labs Lab 03/01/16 0730 03/01/16 1127 03/01/16 1555 03/01/16 2051 03/02/16 0759  GLUCAP 86 91 103* 119* 91   D-Dimer: No results for input(s): DDIMER in the last 72 hours. Hgb A1c: No results for input(s): HGBA1C in the last 72 hours. Lipid Profile: No results for input(s): CHOL, HDL, LDLCALC, TRIG, CHOLHDL, LDLDIRECT in the last 72 hours. Thyroid function studies: No results for input(s): TSH, T4TOTAL, T3FREE, THYROIDAB in the last 72 hours.  Invalid input(s): FREET3 Anemia work up: No results for input(s): VITAMINB12, FOLATE, FERRITIN, TIBC, IRON, RETICCTPCT in the last 72 hours. Sepsis Labs:  Recent Labs Lab 02/25/16 0530 02/26/16 0515 02/27/16 0330 02/29/16 0500  WBC 8.4 8.2 7.9 8.6   Urine analysis: No results found for: COLORURINE, APPEARANCEUR, LABSPEC, PHURINE, GLUCOSEU, HGBUR, BILIRUBINUR, KETONESUR, PROTEINUR, UROBILINOGEN, NITRITE, LEUKOCYTESUR Microbiology Recent Results (from the past 240 hour(s))  Rapid Influenza A&B Antigens ([facility] only)     Status: None   Collection Time: 02/23/16 12:20  PM  Result Value Ref Range Status   Influenza A ([facility]) NEGATIVE NEGATIVE Final   Influenza B ([facility]) NEGATIVE NEGATIVE Final  Blood culture (routine x 2)     Status: None   Collection Time: 02/23/16  2:09 PM  Result Value Ref Range Status   Specimen Description BLOOD LEFT HAND  Final   Special Requests BOTTLES DRAWN AEROBIC AND ANAEROBIC  3CC  Final   Culture NO GROWTH 5 DAYS  Final   Report Status 02/28/2016 FINAL  Final  MRSA PCR Screening     Status: None   Collection Time: 02/24/16 12:24 AM  Result Value Ref Range Status   MRSA by PCR NEGATIVE NEGATIVE Final    Comment:        The GeneXpert MRSA Assay (FDA approved for NASAL specimens only), is one component of a comprehensive MRSA colonization surveillance program. It is not intended to diagnose MRSA infection nor to guide or monitor treatment for MRSA infections.   Culture, blood (routine x 2)     Status:  None   Collection Time: 02/24/16  3:00 AM  Result Value Ref Range Status   Specimen Description BLOOD RIGHT ARM  Final   Special Requests IN PEDIATRIC BOTTLE 3CC  Final   Culture NO GROWTH 5 DAYS  Final   Report Status 02/29/2016 FINAL  Final  Culture, blood (routine x 2)     Status: None   Collection Time: 02/24/16  3:20 AM  Result Value Ref Range Status   Specimen Description BLOOD LEFT HAND  Final   Special Requests BOTTLES DRAWN AEROBIC AND ANAEROBIC 5CC EA  Final   Culture NO GROWTH 5 DAYS  Final   Report Status 02/29/2016 FINAL  Final  Respiratory virus panel     Status: None   Collection Time: 02/24/16 11:31 AM  Result Value Ref Range Status   Respiratory Syncytial Virus A Negative Negative Final   Respiratory Syncytial Virus B Negative Negative Final   Influenza A Negative Negative Final   Influenza B Negative Negative Final   Parainfluenza 1 Negative Negative Final   Parainfluenza 2 Negative Negative Final   Parainfluenza 3 Negative Negative Final   Metapneumovirus Negative Negative Final   Rhinovirus  Negative Negative Final   Adenovirus Negative Negative Final    Comment: (NOTE) Performed At: [facility] [address] [place], [place] [id number] Mila Homer MD WU:[id number]   Culture, respiratory (NON-Expectorated)     Status: None   Collection Time: 02/26/16  4:07 AM  Result Value Ref Range Status   Specimen Description TRACHEAL ASPIRATE  Final   Special Requests NONE  Final   Gram Stain   Final    ABUNDANT WBC PRESENT,BOTH PMN AND MONONUCLEAR RARE SQUAMOUS EPITHELIAL CELLS PRESENT NO ORGANISMS SEEN Performed at [facility]    Culture   Final    NORMAL OROPHARYNGEAL FLORA Performed at [facility]    Report Status 02/28/2016 FINAL  Final    Radiology: Dg Chest Port 1 View  03/01/2016  CLINICAL DATA:  Community-acquired pneumonia, acute respiratory failure, intubated patient. EXAM: PORTABLE CHEST 1 VIEW COMPARISON:  Portable chest x-ray of February 29, 2016 FINDINGS: The lungs are reasonably well inflated allowing for the kyphosis and rotation present. There is patchy density laterally at the left lung base. The cardiac silhouette remains enlarged. The central pulmonary vascularity is engorged. There is no significant pleural effusion and no pneumothorax. The trachea and esophagus have been extubated. IMPRESSION: Stable cardiac enlargement and central pulmonary vascular congestion without significant pulmonary edema. Persistent left basilar atelectasis or pneumonia. Electronically Signed   By: David  [place] M.D.   On: 03/01/2016 07:29   Dg Chest Port 1 View  02/29/2016  CLINICAL DATA:  Intubated.  Pneumonia. EXAM: PORTABLE CHEST 1 VIEW COMPARISON:  02/27/2016 chest radiograph. FINDINGS: Endotracheal tube tip is 3.3 cm above the carina. Enteric tube enters the stomach, with the tip not seen on this image. Stable cardiomediastinal silhouette with top-normal heart size. No pneumothorax. No pleural effusion. Low lung volumes. Patchy bibasilar lung opacities,  slightly increased on the left. Vascular crowding with no overt pulmonary edema. IMPRESSION: 1. Well-positioned endotracheal and enteric tubes. 2. Low lung volumes with patchy bibasilar lung opacities, slightly increased on the left, favor atelectasis, cannot exclude a component of pneumonia. Electronically Signed   By: Delbert Phenix M.D.   On: 02/29/2016 09:36    Medications:   . docusate  200 mg Oral Daily  . furosemide  20 mg Intravenous Daily  . nystatin   Topical TID  .  pantoprazole  40 mg Oral Daily  . polyethylene glycol  17 g Oral Daily   Continuous Infusions:   Time spent: 35 minutes with > 50% of time discussing current diagnostic test results, clinical impression and plan of care.    LOS: 8 days   RAMA,CHRISTINA  [facility] Pager [phone]. If unable to reach me by pager, please call my cell phone at [phone].  *Please refer to [email or web address], password [email or web address] to get updated schedule on who will round on this patient, as hospitalists switch teams weekly. If 7PM-7AM, please contact night-coverage at [email or web address], password [email or web address] for any overnight needs.  03/02/2016, 8:46 AM

## 2016-03-03 LAB — BASIC METABOLIC PANEL
ANION GAP: 15 (ref 5–15)
BUN: 28 mg/dL — ABNORMAL HIGH (ref 6–20)
CHLORIDE: 97 mmol/L — AB (ref 101–111)
CO2: 26 mmol/L (ref 22–32)
Calcium: 9 mg/dL (ref 8.9–10.3)
Creatinine, Ser: 1.17 mg/dL (ref 0.61–1.24)
GFR calc Af Amer: >60 mL/min (ref >60)
GLUCOSE: 93 mg/dL (ref 65–99)
POTASSIUM: 4.1 mmol/L (ref 3.5–5.1)
Sodium: 138 mmol/L (ref 135–145)

## 2016-03-03 LAB — GLUCOSE, CAPILLARY
GLUCOSE-CAPILLARY: 97 mg/dL (ref 65–99)
GLUCOSE-CAPILLARY: 116 mg/dL — AB (ref 65–99)
Glucose-Capillary: 101 mg/dL — ABNORMAL HIGH (ref 65–99)
Glucose-Capillary: 162 mg/dL — ABNORMAL HIGH (ref 65–99)

## 2016-03-03 NOTE — Progress Notes (Deleted)
Patient self-administers CPAP using his mask and machine from home.

## 2016-03-03 NOTE — Progress Notes (Signed)
Progress Note    Dylan BottomsJames Kennedy  ZOX:[id number] DOB: 02/27/1951  DOA: 02/23/2016 PCP: No primary care provider on file.   Outpatient Specialists:   None.   Brief Narrative:   Dylan Kennedy is an 65 y.o. male with a PMH of hypertension, hyperlipidemia, morbid obesity and gout who was admitted by the critical care team on 02/23/16 with acute hypoxic and hypercarbic respiratory failure secondary to multifocal pneumonia requiring ventilatory support/intubation from 02/23/16-02/29/16.  Assessment/Plan:   Principal Problem:   Acute hypercapnic respiratory failure (HCC) secondary to multifocal pneumonia Intubated from 02/23/16-02/29/16. He continues to require nocturnal BiPAP as needed. Has completed a course of antibiotics with Rocephin/azithromycin. Blood and sputum cultures were negative. Respiratory virus panel was negative. Continue pulmonary toilet. Will need home BiPAP set up an outpatient and sleep study arranged.  Active Problems:   CKD (chronic kidney disease) stage 3, GFR 30-59 ml/min Creatinine improved with holding Lasix.    Hypokalemia Repleted.    Morbid obesity with BMI of 40.0-44.9, adult    Essential hypertension Hold Lasix. TTE w/ EF 65-70%, no wall motion abnormalities, normal PA pressure.    Candidal intertrigo Continue nystatin topical powder.  Family Communication/Anticipated D/C date and plan/Code Status   DVT prophylaxis: Ambulatory. Code Status: Full Code.  Family Communication: Sister and niece at bedside 03/02/16, no family present today. Disposition Plan: CIR pending insurance approval and patient compliance.   Medical Consultants:    Pulmonology  Physical Medicine and Rehabilitation   Procedures:   2-D echo  Anti-Infectives:   Vanc 4/14>> 4/14 Zosyn 4/14 > 4/14  Ceftriaxone 4/14 >4/20 Azithro 4/14>4/18  Subjective:   Dylan BottomsJames Deleo denies current dyspnea.  No complaints of pain.  No nausea or vomiting.  Sitting up in chair,  Sleepy.  Objective:    Filed Vitals:   03/02/16 1432 03/02/16 2110 03/03/16 0102 03/03/16 0533  BP: 130/78 133/75  118/72  Pulse: 80 79 84 72  Temp: 98.2 F (36.8 C) 97.5 F (36.4 C)  97.5 F (36.4 C)  TempSrc: Oral Oral  Axillary  Resp: 18 18 20 18   Height:      Weight:      SpO2: 94% 97%  89%    Intake/Output Summary (Last 24 hours) at 03/03/16 0847 Last data filed at 03/03/16 0808  Gross per 24 hour  Intake   1080 ml  Output    950 ml  Net    130 ml   Filed Weights   02/27/16 0426 03/01/16 0425 03/01/16 2041  Weight: 151.048 kg (333 lb) 156 kg (343 lb 14.7 oz) 135.762 kg (299 lb 4.8 oz)    Exam: General exam: Appears calm and comfortable. Sitting up in chair with oxygen on. Respiratory system: Clear to auscultation but diminished. Respiratory effort normal. Cardiovascular system: S1 & S2 heard, RRR. No JVD, murmurs, rubs, gallops or clicks. Trace pedal edema. Gastrointestinal system: Abdomen is nondistended, obese and nontender. No organomegaly or masses felt. Normal bowel sounds heard. Central nervous system: Sleepy. No focal neurological deficits. Extremities: Symmetric 5 x 5 power. No clubbing, or cyanosis. Skin: No rashes, lesions or ulcers Psychiatry: Judgement and insight appear normal. Mood & affect flat.   Data Reviewed:   I have personally reviewed following labs and imaging studies:  Labs: Basic Metabolic Panel:  Recent Labs Lab 02/26/16 0515 02/27/16 0330 02/28/16 0505 02/29/16 0500 03/01/16 0258 03/02/16 0523 03/03/16 0617  NA 145 146* 143 142 143 140 138  K 3.6 3.6 3.8 3.7 3.7  3.8 4.1  CL 105 103 103 100* 99* 94* 97*  CO2 30 32 34* 26  GLUCOSE 93 99 99 98 90 94 93  BUN 28* 31* 32* 34* 35* 37* 28*  CREATININE 1.18 1.10 1.06 1.07 1.28* 1.41* 1.17  CALCIUM 8.4* 8.8* 9.3 9.3 9.0 9.0 9.0  MG 2.1 2.2  --  2.4  --   --   --   PHOS 3.8 3.7  --  4.3  --   --   --    GFR Estimated Creatinine Clearance: 88.4 mL/min (by C-G formula  based on Cr of 1.17).  CBC:  Recent Labs Lab 02/26/16 0515 02/27/16 0330 02/29/16 0500  WBC 8.2 7.9 8.6  HGB 11.2* 11.2* 12.2*  HCT 39.0 38.5* 40.5  MCV 90.5 90.0 87.3  PLT 174 163 239   CBG:  Recent Labs Lab 03/02/16 0759 03/02/16 1155 03/02/16 1749 03/02/16 2112 03/03/16 0802  GLUCAP 91 104* 130* 100* 101*   Sepsis Labs:  Recent Labs Lab 02/26/16 0515 02/27/16 0330 02/29/16 0500  WBC 8.2 7.9 8.6   Microbiology Recent Results (from the past 240 hour(s))  Rapid Influenza A&B Antigens ([facility] only)     Status: None   Collection Time: 02/23/16 12:20 PM  Result Value Ref Range Status   Influenza A ([facility]) NEGATIVE NEGATIVE Final   Influenza B ([facility]) NEGATIVE NEGATIVE Final  Blood culture (routine x 2)     Status: None   Collection Time: 02/23/16  2:09 PM  Result Value Ref Range Status   Specimen Description BLOOD LEFT HAND  Final   Special Requests BOTTLES DRAWN AEROBIC AND ANAEROBIC  3CC  Final   Culture NO GROWTH 5 DAYS  Final   Report Status 02/28/2016 FINAL  Final  MRSA PCR Screening     Status: None   Collection Time: 02/24/16 12:24 AM  Result Value Ref Range Status   MRSA by PCR NEGATIVE NEGATIVE Final    Comment:        The GeneXpert MRSA Assay (FDA approved for NASAL specimens only), is one component of a comprehensive MRSA colonization surveillance program. It is not intended to diagnose MRSA infection nor to guide or monitor treatment for MRSA infections.   Culture, blood (routine x 2)     Status: None   Collection Time: 02/24/16  3:00 AM  Result Value Ref Range Status   Specimen Description BLOOD RIGHT ARM  Final   Special Requests IN PEDIATRIC BOTTLE 3CC  Final   Culture NO GROWTH 5 DAYS  Final   Report Status 02/29/2016 FINAL  Final  Culture, blood (routine x 2)     Status: None   Collection Time: 02/24/16  3:20 AM  Result Value Ref Range Status   Specimen Description BLOOD LEFT HAND  Final   Special Requests BOTTLES DRAWN AEROBIC AND  ANAEROBIC 5CC EA  Final   Culture NO GROWTH 5 DAYS  Final   Report Status 02/29/2016 FINAL  Final  Respiratory virus panel     Status: None   Collection Time: 02/24/16 11:31 AM  Result Value Ref Range Status   Respiratory Syncytial Virus A Negative Negative Final   Respiratory Syncytial Virus B Negative Negative Final   Influenza A Negative Negative Final   Influenza B Negative Negative Final   Parainfluenza 1 Negative Negative Final   Parainfluenza 2 Negative Negative Final   Parainfluenza 3 Negative Negative Final   Metapneumovirus Negative Negative Final   Rhinovirus Negative Negative Final  Adenovirus Negative Negative Final    Comment: (NOTE) Performed At: [facility] [address] [place], [place] [id number] Mila Homer MD WU:[id number]   Culture, respiratory (NON-Expectorated)     Status: None   Collection Time: 02/26/16  4:07 AM  Result Value Ref Range Status   Specimen Description TRACHEAL ASPIRATE  Final   Special Requests NONE  Final   Gram Stain   Final    ABUNDANT WBC PRESENT,BOTH PMN AND MONONUCLEAR RARE SQUAMOUS EPITHELIAL CELLS PRESENT NO ORGANISMS SEEN Performed at [facility]    Culture   Final    NORMAL OROPHARYNGEAL FLORA Performed at [facility]    Report Status 02/28/2016 FINAL  Final    Radiology: No results found.  Medications:   . docusate  200 mg Oral Daily  . nystatin   Topical TID  . pantoprazole  40 mg Oral Daily  . polyethylene glycol  17 g Oral Daily   Continuous Infusions:   Time spent: 25 minutes.    LOS: 9 days   Markan Cazarez  [facility] Pager [phone]. If unable to reach me by pager, please call my cell phone at [phone].  *Please refer to [email or web address], password [email or web address] to get updated schedule on who will round on this patient, as hospitalists switch teams weekly. If 7PM-7AM, please contact night-coverage at [email or web address], password [email or web address] for any overnight needs.  03/03/2016, 8:47 AM

## 2016-03-04 LAB — GLUCOSE, CAPILLARY
GLUCOSE-CAPILLARY: 93 mg/dL (ref 65–99)
GLUCOSE-CAPILLARY: 109 mg/dL — AB (ref 65–99)
Glucose-Capillary: 97 mg/dL (ref 65–99)
Glucose-Capillary: 107 mg/dL — ABNORMAL HIGH (ref 65–99)

## 2016-03-04 NOTE — Progress Notes (Signed)
   SATURATION QUALIFICATIONS: (This note is used to comply with regulatory documentation for home oxygen)  Patient Saturations on Room Air at Rest = 93%  Patient Saturations on Room Air while Ambulating = 86%  Patient Saturations on 2 Liters of oxygen while Ambulating = 93%  Please briefly explain why patient needs home oxygen: desaturation with activity on RA.   Lyanne CoVictoria Caleen Taaffe, PT  Acute Rehab Services  [phone]

## 2016-03-04 NOTE — Progress Notes (Signed)
Nutrition Follow-up  DOCUMENTATION CODES:   Morbid obesity  INTERVENTION:   -Continue dysphagia 2 diet with thin liquids  NUTRITION DIAGNOSIS:   Inadequate oral intake related to inability to eat as evidenced by NPO status.  Resolved  GOAL:   Patient will meet greater than or equal to 90% of their needs  Met  MONITOR:   Diet advancement, PO intake, Skin, I & O's, Labs, Weight trends  REASON FOR ASSESSMENT:   Ventilator    ASSESSMENT:   65 yo with history of HTN, HLD, morbid obesity, gout who came in with 4 day history of shortness of breath, worse with exertion., subjective fevers, and intermittent left sided chest soreness. His O2 sats was 63% on 3L [place]. Was on nonrebreather, and CXR shows right middle lobe atelectasis- can not exclude pneumonia. Was started on IV fluids, ceftriaxone, and azithromycin. CT of chest showed multifocal pneumonia. He was put on bipap and by the evening, pt had worsening acidosis and hypercarbia on his repeat ABG. Pt was then intubated.   Pt extubated on 02/29/16. Pt transferred from ICU to surgical floor on 03/02/16.   Pt underwent BSE on 03/01/16. Pt was advanced to dysphagia 2 diet with thin liquids. Pt tolerating diet well with good appetite. Meal completion 100%.   Therapy and CIR admissions team following. Plan to d/c to CIR once medically stable, pending insurance approval.   Labs reviewed.   Diet Order:  DIET DYS 2 Room service appropriate?: Yes; Fluid consistency:: Thin  Skin:  Reviewed, no issues  Last BM:  03/02/16  Height:   Ht Readings from Last 1 Encounters:  03/01/16 5' 9"  (1.753 m)    Weight:   Wt Readings from Last 1 Encounters:  03/01/16 299 lb 4.8 oz (135.762 kg)    Ideal Body Weight:  72.73 kg (kg)  BMI:  Body mass index is 44.18 kg/(m^2).  Estimated Nutritional Needs:   Kcal:  2100-2300  Protein:  100-115 grams  Fluid:  2.1-2.3 L  EDUCATION NEEDS:   No education needs identified at this  time  Kaidin Boehle A. Jimmye Norman, RD, LDN, CDE Pager: [phone] After hours Pager: [phone]

## 2016-03-04 NOTE — Progress Notes (Signed)
Occupational Therapy Treatment Patient Details Name: Dylan Kennedy MRN: [id number] DOB: 11/27/1950 Today's Date: 03/04/2016    History of present illness 65 yo with history of HTN, HLD, morbid obesity, gout who came in with 4 day history of shortness of breath, worse with exertion., subjective fevers, and intermittent left sided chest soreness. His O2 sats was 63% on 3L [place]. Was on nonrebreather, and CXR shows right middle lobe atelectasis- can not exclude pneumonia. . CT of chest showed multifocal pneumonia.  Failed on bipap so was intubated 4/14 - 4/20.   OT comments  Patient making good progress towards OT goals, continue plan of care for now. As stated below, depending on patient's progress and level of assistance at discharge, pt may be able to d/c back home.    Follow Up Recommendations  CIR;Supervision/Assistance - 24 hour (may be able to go home depending on progress and available assistance at d/c)    Equipment Recommendations  3 in 1 bedside comode;Other (comment) (wide)    Recommendations for Other Services Rehab consult    Precautions / Restrictions Precautions Precautions: Fall Precaution Comments: watch O2 Sats Restrictions Weight Bearing Restrictions: No    Mobility Bed Mobility General bed mobility comments: OOB in chair  Transfers Overall transfer level: Needs assistance Equipment used: Rolling walker (2 wheeled) Transfers: Sit to/from Stand Sit to Stand: Supervision General transfer comment: pt able to stand from chair with supervision for safety but no physical assistance needed or LOB    Balance Overall balance assessment: Needs assistance Sitting-balance support: No upper extremity supported;Feet supported Sitting balance-Leahy Scale: Good     Standing balance support: Bilateral upper extremity supported;During functional activity Standing balance-Leahy Scale: Fair Standing balance comment: heavy lean onto RW   ADL Overall ADL's : Needs  assistance/impaired     Grooming: Set up;Sitting   Upper Body Bathing: Supervision/ safety;Sitting;Set up   Lower Body Bathing: Supervison/ safety;Sit to/from stand   Upper Body Dressing : Supervision/safety;Set up;Sitting   Lower Body Dressing: Supervision/safety;Sit to/from stand   Toilet Transfer: Min guard;Ambulation;BSC;RW General ADL Comments: Pt continues to fatigue easily. Pt's sats ranged from 84-93% on RA during activity. Sats increased to greater than 90% with pursed lip breathing and seated rest break. Educated pt on energy conservation techniques.      Cognition   Behavior During Therapy: WFL for tasks assessed/performed Overall Cognitive Status: Within Functional Limits for tasks assessed                 Pertinent Vitals/ Pain       Pain Assessment: No/denies pain Faces Pain Scale: Hurts little more Pain Location: back with ambulation Pain Descriptors / Indicators: Aching Pain Intervention(s): Limited activity within patient's tolerance;Monitored during session   Frequency Min 2X/week     Progress Toward Goals  OT Goals(current goals can now befound in the care plan section)  Progress towards OT goals: Progressing toward goals  Acute Rehab OT Goals Patient Stated Goal: to return home OT Goal Formulation: With patient Time For Goal Achievement: 03/14/16 Potential to Achieve Goals: Good  Plan Discharge plan remains appropriate       End of Session Equipment Utilized During Treatment: Rolling walker   Activity Tolerance Patient tolerated treatment well   Patient Left in chair;with call bell/phone within reach     Time: [id number] OT Time Calculation (min): 14 min  Charges: OT General Charges $OT Visit: 1 Procedure OT Treatments $Self Care/Home Management : 8-22 mins  Edwin CapPatricia Arline Ketter , MS, OTR/L, CLT Pager:  [phone]  03/04/2016, 3:56 PM

## 2016-03-04 NOTE — Progress Notes (Signed)
Physical Therapy Treatment Patient Details Name: Dylan BottomsJames Kennedy MRN: [id number] DOB: 06/26/1951 Today's Date: 03/04/2016    History of Present Illness 65 yo with history of HTN, HLD, morbid obesity, gout who came in with 4 day history of shortness of breath, worse with exertion., subjective fevers, and intermittent left sided chest soreness. His O2 sats was 63% on 3L [place]. Was on nonrebreather, and CXR shows right middle lobe atelectasis- can not exclude pneumonia. . CT of chest showed multifocal pneumonia.  Failed on bipap so was intubated 4/14 - 4/20.    PT Comments    Pt progressing with ambulation, 250' with bariatric RW and min-guard A. O2 sats dropped to 86% on RA with ambulation, increased to 93% on 2L. Pt admits to back pain that shoots down the back of his legs after he has been walking a few minutes. Recommend f/u for this as it is limiting his mobility as much as his breathing issues. PT will continue to follow.   Follow Up Recommendations  CIR     Equipment Recommendations  Other (comment) (bariatric rollator)    Recommendations for Other Services Rehab consult;OT consult     Precautions / Restrictions Precautions Precautions: Fall Precaution Comments: watch O2 Sats Restrictions Weight Bearing Restrictions: No    Mobility  Bed Mobility               General bed mobility comments: OOB in chair  Transfers Overall transfer level: Needs assistance Equipment used: Rolling walker (2 wheeled) Transfers: Sit to/from Stand Sit to Stand: Supervision         General transfer comment: pt able to stand from chair with supervision for safety but no physical assistance needed or LOB  Ambulation/Gait Ambulation/Gait assistance: Min guard Ambulation Distance (Feet): 250 Feet Assistive device: Rolling walker (2 wheeled) (bariatric RW) Gait Pattern/deviations: Step-through pattern;Trunk flexed;Wide base of support Gait velocity: decreased Gait velocity  interpretation: Below normal speed for age/gender General Gait Details: Pt maintained O2 sats 93% when ambulating on 2L O2, off O2 though dropped to 86%, returned to 93% when O2 reapplied. Pt c/o back pain with increasing ambulation distance and reports that he has had symptoms of stenosis for quite awhile and has not see anyone about this.    Stairs            Wheelchair Mobility    Modified Rankin (Stroke Patients Only)       Balance Overall balance assessment: Needs assistance Sitting-balance support: Feet supported;No upper extremity supported Sitting balance-Leahy Scale: Good     Standing balance support: Single extremity supported Standing balance-Leahy Scale: Poor Standing balance comment: heavy lean onto RW                    Cognition Arousal/Alertness: Awake/alert Behavior During Therapy: WFL for tasks assessed/performed Overall Cognitive Status: Within Functional Limits for tasks assessed                      Exercises      General Comments General comments (skin integrity, edema, etc.): Pt would benefit from bariatric rollator for energy conservation as well as mgmt of back pain. He reports that he "didn't move around much" PTA because of no energy and back pain      Pertinent Vitals/Pain Pain Assessment: Faces Faces Pain Scale: Hurts little more Pain Location: back with ambulation Pain Descriptors / Indicators: Aching Pain Intervention(s): Limited activity within patient's tolerance;Monitored during session    Home Living  Prior Function            PT Goals (current goals can now be found in the care plan section) Acute Rehab PT Goals Patient Stated Goal: to return home PT Goal Formulation: With patient Time For Goal Achievement: 03/12/16 Potential to Achieve Goals: Good Progress towards PT goals: Progressing toward goals    Frequency  Min 3X/week    PT Plan Current plan remains appropriate     Co-evaluation             End of Session Equipment Utilized During Treatment: Gait belt;Oxygen Activity Tolerance: Patient tolerated treatment well Patient left: in chair;with call bell/phone within reach     Time: 1214-1237 PT Time Calculation (min) (ACUTE ONLY): 23 min  Charges:  $Gait Training: 23-37 mins                    G Codes:     Lyanne Co, PT  Acute Rehab Services  [phone]  Lyanne Co 03/04/2016, 12:56 PM

## 2016-03-04 NOTE — Progress Notes (Signed)
Progress Note    Dylan Kennedy  BJY:[id number] DOB: Nov 25, 1950  DOA: 02/23/2016 PCP: No primary care provider on file.   Outpatient Specialists:   None.   Brief Narrative:   Dylan Kennedy is an 65 y.o. male with a PMH of hypertension, hyperlipidemia, morbid obesity and gout who was admitted by the critical care team on 02/23/16 with acute hypoxic and hypercarbic respiratory failure secondary to multifocal pneumonia requiring ventilatory support/intubation from 02/23/16-02/29/16.  Assessment/Plan:   Principal Problem:   Acute hypercapnic respiratory failure (HCC) secondary to multifocal pneumonia Intubated from 02/23/16-02/29/16. He continues to require nocturnal BiPAP as needed. Has completed a course of antibiotics with Rocephin/azithromycin. Blood and sputum cultures were negative. Respiratory virus panel was negative. Continue pulmonary toilet. Will need home BiPAP set up an outpatient and sleep study arranged.  Active Problems:   CKD (chronic kidney disease) stage 3, GFR 30-59 ml/min Creatinine improved with holding Lasix.    Hypokalemia Repleted.    Morbid obesity with BMI of 40.0-44.9, adult    Essential hypertension Hold Lasix. TTE w/ EF 65-70%, no wall motion abnormalities, normal PA pressure.    Candidal intertrigo Continue nystatin topical powder.  Family Communication/Anticipated D/C date and plan/Code Status   DVT prophylaxis: Ambulatory. Code Status: Full Code.  Family Communication: Sister and niece at bedside 03/02/16, no family present today. Disposition Plan: CIR pending insurance approval and patient compliance.   Medical Consultants:    Pulmonology  Physical Medicine and Rehabilitation   Procedures:   2-D echo  Anti-Infectives:   Vanc 4/14>> 4/14 Zosyn 4/14 > 4/14  Ceftriaxone 4/14 >4/20 Azithro 4/14>4/18  Subjective:   Dylan Kennedy denies current dyspnea.  No complaints of pain.  No nausea or vomiting.  Appetite OK.  Sitting  up in chair.  Objective:    Filed Vitals:   03/03/16 1335 03/03/16 2058 03/03/16 2356 03/04/16 0517  BP: 126/60 135/83  128/72  Pulse: 78 81 85 71  Temp: 98.4 F (36.9 C) 98 F (36.7 C)  97.6 F (36.4 C)  TempSrc: Oral Oral  Oral  Resp:  Height:      Weight:      SpO2: 95% 98% 97% 98%    Intake/Output Summary (Last 24 hours) at 03/04/16 1346 Last data filed at 03/04/16 0900  Gross per 24 hour  Intake    360 ml  Output    350 ml  Net     10 ml   Filed Weights   02/27/16 0426 03/01/16 0425 03/01/16 2041  Weight: 151.048 kg (333 lb) 156 kg (343 lb 14.7 oz) 135.762 kg (299 lb 4.8 oz)    Exam: General exam: Appears calm and comfortable. Sitting up in chair with oxygen on. Respiratory system: Clear to auscultation but diminished. Respiratory effort normal. Cardiovascular system: S1 & S2 heard, RRR. No JVD, murmurs, rubs, gallops or clicks. Trace pedal edema. Gastrointestinal system: Abdomen is nondistended, obese and nontender. No organomegaly or masses felt. Normal bowel sounds heard. Central nervous system: Sleepy. No focal neurological deficits. Extremities: Symmetric 5 x 5 power. No clubbing, or cyanosis. Skin: No rashes, lesions or ulcers Psychiatry: Judgement and insight appear normal. Mood & affect flat.   Data Reviewed:   I have personally reviewed following labs and imaging studies:  Labs: Basic Metabolic Panel:  Recent Labs Lab 02/27/16 0330 02/28/16 0505 02/29/16 0500 03/01/16 0258 03/02/16 0523 03/03/16 0617  NA 146* 143 142 143 140 138  K 3.6 3.8 3.7 3.7  3.8 4.1  CL 103 103 100* 99* 94* 97*  CO2 32 31 30 31  34* 26  GLUCOSE 99 99 98 90 94 93  BUN 31* 32* 34* 35* 37* 28*  CREATININE 1.10 1.06 1.07 1.28* 1.41* 1.17  CALCIUM 8.8* 9.3 9.3 9.0 9.0 9.0  MG 2.2  --  2.4  --   --   --   PHOS 3.7  --  4.3  --   --   --    GFR Estimated Creatinine Clearance: 88.4 mL/min (by C-G formula based on Cr of 1.17).  CBC:  Recent Labs Lab  02/27/16 0330 02/29/16 0500  WBC 7.9 8.6  HGB 11.2* 12.2*  HCT 38.5* 40.5  MCV 90.0 87.3  PLT 163 239   CBG:  Recent Labs Lab 03/03/16 1152 03/03/16 1650 03/03/16 2108 03/04/16 0723 03/04/16 1148  GLUCAP 162* 97 116* 97 107*   Sepsis Labs:  Recent Labs Lab 02/27/16 0330 02/29/16 0500  WBC 7.9 8.6   Microbiology Recent Results (from the past 240 hour(s))  Blood culture (routine x 2)     Status: None   Collection Time: 02/23/16  2:09 PM  Result Value Ref Range Status   Specimen Description BLOOD LEFT HAND  Final   Special Requests BOTTLES DRAWN AEROBIC AND ANAEROBIC  3CC  Final   Culture NO GROWTH 5 DAYS  Final   Report Status 02/28/2016 FINAL  Final  MRSA PCR Screening     Status: None   Collection Time: 02/24/16 12:24 AM  Result Value Ref Range Status   MRSA by PCR NEGATIVE NEGATIVE Final    Comment:        The GeneXpert MRSA Assay (FDA approved for NASAL specimens only), is one component of a comprehensive MRSA colonization surveillance program. It is not intended to diagnose MRSA infection nor to guide or monitor treatment for MRSA infections.   Culture, blood (routine x 2)     Status: None   Collection Time: 02/24/16  3:00 AM  Result Value Ref Range Status   Specimen Description BLOOD RIGHT ARM  Final   Special Requests IN PEDIATRIC BOTTLE 3CC  Final   Culture NO GROWTH 5 DAYS  Final   Report Status 02/29/2016 FINAL  Final  Culture, blood (routine x 2)     Status: None   Collection Time: 02/24/16  3:20 AM  Result Value Ref Range Status   Specimen Description BLOOD LEFT HAND  Final   Special Requests BOTTLES DRAWN AEROBIC AND ANAEROBIC 5CC EA  Final   Culture NO GROWTH 5 DAYS  Final   Report Status 02/29/2016 FINAL  Final  Respiratory virus panel     Status: None   Collection Time: 02/24/16 11:31 AM  Result Value Ref Range Status   Respiratory Syncytial Virus A Negative Negative Final   Respiratory Syncytial Virus B Negative Negative Final    Influenza A Negative Negative Final   Influenza B Negative Negative Final   Parainfluenza 1 Negative Negative Final   Parainfluenza 2 Negative Negative Final   Parainfluenza 3 Negative Negative Final   Metapneumovirus Negative Negative Final   Rhinovirus Negative Negative Final   Adenovirus Negative Negative Final    Comment: (NOTE) Performed At: [facility] [address] [place], [place] [id number] Mila HomerHancock William F MD NW:[id number]:[phone]   Culture, respiratory (NON-Expectorated)     Status: None   Collection Time: 02/26/16  4:07 AM  Result Value Ref Range Status   Specimen Description TRACHEAL ASPIRATE  Final  Special Requests NONE  Final   Gram Stain   Final    ABUNDANT WBC PRESENT,BOTH PMN AND MONONUCLEAR RARE SQUAMOUS EPITHELIAL CELLS PRESENT NO ORGANISMS SEEN Performed at [facility]    Culture   Final    NORMAL OROPHARYNGEAL FLORA Performed at [facility]    Report Status 02/28/2016 FINAL  Final    Radiology: No results found.  Medications:   . docusate  200 mg Oral Daily  . nystatin   Topical TID  . pantoprazole  40 mg Oral Daily  . polyethylene glycol  17 g Oral Daily   Continuous Infusions:   Time spent: 25 minutes.    LOS: 10 days   Carey Johndrow  [facility] Pager [phone]. If unable to reach me by pager, please call my cell phone at [phone].  *Please refer to [email or web address], password [email or web address] to get updated schedule on who will round on this patient, as hospitalists switch teams weekly. If 7PM-7AM, please contact night-coverage at [email or web address], password [email or web address] for any overnight needs.  03/04/2016, 1:46 PM

## 2016-03-04 NOTE — Care Management Note (Signed)
Case Management Note  Patient Details  Name: Dylan Kennedy MRN: [id number] Date of Birth: 08/26/1951  Subjective/Objective:                    Action/Plan:  UR updated . Sleep study referral form placed on shadow chart , for MD to complete and sign . Thanks Dylan FlurryHeather Travis Mastel RN BSN [phone]  Expected Discharge Date:                  Expected Discharge Plan:  IP Rehab Facility  In-House Referral:     Discharge planning Services  CM Consult  Post Acute Care Choice:    Choice offered to:     DME Arranged:    DME Agency:     HH Arranged:    HH Agency:     Status of Service:  In process, will continue to follow  Medicare Important Message Given:    Date Medicare IM Given:    Medicare IM give by:    Date Additional Medicare IM Given:    Additional Medicare Important Message give by:     If discussed at Long Length of Stay Meetings, dates discussed:    Additional Comments:  Kingsley PlanWile, Dylan Garden[place] [name], RN 03/04/2016, 10:49 AM

## 2016-03-04 NOTE — Progress Notes (Signed)
I met with pt at bedside to discuss his plans for d/c. He states he is doing better today than he was last week moving around. Normally very sedentary at home. Sister lives next door. Walked to chair this morning with nursing with no assist per pt report. I await therapy assessment today to assist in planning dispo. I have initiated insurance authorization for a possible inpt rehab admission.[phone]

## 2016-03-05 ENCOUNTER — Encounter (HOSPITAL_COMMUNITY): Payer: Self-pay | Admitting: General Practice

## 2016-03-05 LAB — GLUCOSE, CAPILLARY
GLUCOSE-CAPILLARY: 100 mg/dL — AB (ref 65–99)
Glucose-Capillary: 92 mg/dL (ref 65–99)
Glucose-Capillary: 115 mg/dL — ABNORMAL HIGH (ref 65–99)
Glucose-Capillary: 88 mg/dL (ref 65–99)

## 2016-03-05 NOTE — [place] FL2 (Signed)
  [place] MEDICAID FL2 LEVEL OF CARE SCREENING TOOL     IDENTIFICATION  Patient Name: Dylan Kennedy Birthdate: 01/21/1951 Sex: male Admission Date (Current Location): 02/23/2016  [facility] [place]   Facility and Address:  [facility], [address], [place], [place] [address]      Provider Number: [id number]  Attending Physician Name and Address:  Maryruth Bunhristina P Rama, MD  Relative Name and Phone Number:  Doreatha Lewash,Jamie Sister [phone]    Current Level of Care:   Recommended Level of Care: Skilled Nursing Facility Prior Approval Number:    Date Approved/Denied:   PASRR Number: [id number]  Discharge Plan: SNF    Current Diagnoses: Patient Active Problem List   Diagnosis Date Noted  . Morbid obesity with BMI of 40.0-44.9, adult (HCC) 03/02/2016  . Endotracheally intubated   . Acute hypercapnic respiratory failure (HCC) 02/24/2016  . CKD (chronic kidney disease) stage 3, GFR 30-59 ml/min 02/24/2016  . Hypokalemia 02/24/2016  . Essential hypertension 02/24/2016  . Gout 02/24/2016  . Candidal intertrigo 02/24/2016  . Acute respiratory failure with hypoxia (HCC)   . Community acquired pneumonia   . Sepsis (HCC)   . Septic shock (HCC)     Orientation RESPIRATION BLADDER Height & Weight     Self, Time, Situation, Place  O2 Continent Weight: 299 lb 4.8 oz (135.762 kg) Height:  5\' 9"  (175.3 cm)  BEHAVIORAL SYMPTOMS/MOOD NEUROLOGICAL BOWEL NUTRITION STATUS      Continent Diet (Dysphagia 2)  AMBULATORY STATUS COMMUNICATION OF NEEDS Skin   Limited Assist Verbally Normal                       Personal Care Assistance Level of Assistance  Bathing, Dressing Bathing Assistance: Limited assistance   Dressing Assistance: Limited assistance     Functional Limitations Info             SPECIAL CARE FACTORS FREQUENCY  PT (By licensed PT)     PT Frequency: 5x a week              Contractures      Additional  Factors Info  Code Status, Allergies Code Status Info: Full Code Allergies Info: Bee Venom           Current Medications (03/05/2016):  This is the current hospital active medication list Current Facility-Administered Medications  Medication Dose Route Frequency Provider Last Rate Last Dose  . docusate (COLACE) 50 MG/5ML liquid 200 mg  200 mg Oral Daily Valentino NoseNathan Boswell, MD   200 mg at 03/04/16 [id number]  . hydrOXYzine (ATARAX/VISTARIL) tablet 25 mg  25 mg Oral TID PRN Oretha Milchakesh Alva V, MD   25 mg at 02/29/16 0640  . nystatin (MYCOSTATIN/NYSTOP) topical powder   Topical TID Yolanda MangesAlex M Wilson, DO      . pantoprazole (PROTONIX) EC tablet 40 mg  40 mg Oral Daily [place] Pinkicholas P Gazda, RPH   40 mg at 03/05/16 1028  . polyethylene glycol (MIRALAX / GLYCOLAX) packet 17 g  17 g Oral Daily Valentino NoseNathan Boswell, MD   17 g at 03/04/16 0937  . sodium chloride flush (NS) 0.9 % injection 10-40 mL  10-40 mL Intracatheter PRN Maryruth Bunhristina P Rama, MD         Discharge Medications: Please see discharge summary for a list of discharge medications.  Relevant Imaging Results:  Relevant Lab Results:   Additional Information SSN [id number]  Darleene Cleavernterhaus, Rajan Burgard R, [place]

## 2016-03-05 NOTE — Clinical Social Work Note (Signed)
Clinical Social Work Assessment  Patient Details  Name: Dylan Kennedy MRN: [id number] Date of Birth: 09/30/1951  Date of referral:  03/05/16               Reason for consult:  Facility Placement                Permission sought to share information with:  Family Supports Permission granted to share information::  Yes, Verbal Permission Granted  Name::     Nash,Jamie Sister [phone]  Agency::  SNF admissions  Relationship::     Contact Information:     Housing/Transportation Living arrangements for the past 2 months:  Single Family Home Source of Information:  Patient Patient Interpreter Needed:  None Criminal Activity/Legal Involvement Pertinent to Current Situation/Hospitalization:  No - Comment as needed Significant Relationships:  Siblings Lives with:  Self Do you feel safe going back to the place where you live?  No (Patient needs short term rehab before he can go home.) Need for family participation in patient care:  No (Coment)  Care giving concerns:  Patient needs short term rehab before he can go home.   Social Worker assessment / plan:  Patient is a 65 year old male who lives alone.  Patient is alert and oriented x4 and able to make his own decisions, patient states he needs some rehab before he can return back home.  Patient expressed that he has not been to SNF for rehab.  CSW explained to patient what to expect and how insurance will pay for his stay.  Patient's sister was at bedside and she agreed that patient needs SNF before he can return back home.  Patient states he is hopeful to get rehab before he can return back home.  Patient and his sister did not express any other questions or concerns.  Employment status:  Unemployed Health and safety inspectornsurance information:  Managed Care PT Recommendations:  Skilled Nursing Facility Information / Referral to community resources:     Patient/Family's Response to care: Patient is agreeable to going to SNF for short term  rehab.  Patient/Family's Understanding of and Emotional Response to Diagnosis, Current Treatment, and Prognosis:  Patient is aware of current diagnosis and treatment plan.  Emotional Assessment Appearance:  Appears stated age Attitude/Demeanor/Rapport:    Affect (typically observed):  Calm, Pleasant, Appropriate Orientation:  Oriented to Self, Oriented to Place, Oriented to  Time, Oriented to Situation Alcohol / Substance use:  Not Applicable Psych involvement (Current and /or in the community):  No (Comment)  Discharge Needs  Concerns to be addressed:    Readmission within the last 30 days:  No Current discharge risk:  Lack of support system Barriers to Discharge:  Insurance Authorization   [place] Constablenterhaus, Shataya Winkles R, LCSWA 03/05/2016, 2:08 PM

## 2016-03-05 NOTE — Progress Notes (Signed)
Occupational Therapy Treatment Patient Details Name: Dylan BottomsJames Cortese MRN: [id number] DOB: 05/11/1951 Today's Date: 03/05/2016    History of present illness 65 yo with history of HTN, HLD, morbid obesity, gout who came in with 4 day history of shortness of breath, worse with exertion., subjective fevers, and intermittent left sided chest soreness. His O2 sats was 63% on 3L [place]. Was on nonrebreather, and CXR shows right middle lobe atelectasis- can not exclude pneumonia. . CT of chest showed multifocal pneumonia.  Failed on bipap so was intubated 4/14 - 4/20.   OT comments  Pt doing well with all adls and overall is at S level.  Pt's family states his home is not safe to return to and sister cannot take pt home.  Pt close to meeting goals.  Will continue to reach mod I level of care.   Follow Up Recommendations  SNF    Equipment Recommendations  3 in 1 bedside comode;Other (comment)    Recommendations for Other Services      Precautions / Restrictions Precautions Precautions: Fall Precaution Comments: watch O2 Sats Restrictions Weight Bearing Restrictions: No       Mobility Bed Mobility               General bed mobility comments: OOB in chair on arrival.  Transfers Overall transfer level: Needs assistance Equipment used: Rolling walker (2 wheeled) Transfers: Sit to/from Stand Sit to Stand: Supervision         General transfer comment: pt able to stand from chair with supervision for safety but no physical assistance needed or LOB    Balance Overall balance assessment: Needs assistance Sitting-balance support: Feet supported Sitting balance-Leahy Scale: Good     Standing balance support: Bilateral upper extremity supported;During functional activity Standing balance-Leahy Scale: Fair Standing balance comment: Pt able to let go of walker for short amounts of time to do peri care and LE dressing in standing.                   ADL Overall ADL's : Needs  assistance/impaired     Grooming: Wash/dry hands;Wash/dry face;Supervision/safety;Standing Grooming Details (indicate cue type and reason): Pt stood at sink for 4-5 min with supervision to groom.         Upper Body Dressing : Supervision/safety;Set up;Sitting   Lower Body Dressing: Supervision/safety;Sit to/from stand Lower Body Dressing Details (indicate cue type and reason): Pt walked with walker to gather his own clothes.  No assist needed except cues to watch for O2 line on floor. Toilet Transfer: Supervision/safety;Comfort height toilet;Grab bars;Ambulation;RW Toilet Transfer Details (indicate cue type and reason): Pt walked to bathroom and toileted with S.  No LOB noted. Toileting- Clothing Manipulation and Hygiene: Minimal assistance;Sit to/from stand Toileting - Clothing Manipulation Details (indicate cue type and reason): Therapist made sure pt cleaned self well after bowel movement.  Body habitus makes this difficult.  PT may benefit from toilet aid but was not interested.     Functional mobility during ADLs: Supervision/safety General ADL Comments: Reviewed energy conservation techniques as pt was tired after walking to bathroom to toilet.  Pt requires several cues for walker safety.  Pt leaves walker at times not always keeping it close by which is a fall risk.      Vision                     Perception     Praxis      Cognition   Behavior During Therapy:  WFL for tasks assessed/performed Overall Cognitive Status: Within Functional Limits for tasks assessed                       Extremity/Trunk Assessment               Exercises     Shoulder Instructions       General Comments      Pertinent Vitals/ Pain       Pain Assessment: No/denies pain  Home Living Family/patient expects to be discharged to:: Private residence Living Arrangements: Alone                                      Prior Functioning/Environment               Frequency Min 2X/week     Progress Toward Goals  OT Goals(current goals can now be found in the care plan section)  Progress towards OT goals: Progressing toward goals  Acute Rehab OT Goals Patient Stated Goal: to return home OT Goal Formulation: With patient Time For Goal Achievement: 03/14/16 Potential to Achieve Goals: Good ADL Goals Pt Will Perform Lower Body Bathing: with modified independence;with adaptive equipment;sit to/from stand Pt Will Perform Lower Body Dressing: with modified independence;sit to/from stand;with adaptive equipment Pt Will Transfer to Toilet: with modified independence;ambulating;bedside commode Pt/caregiver will Perform Home Exercise Program: Both right and left upper extremity;Independently;With written HEP provided Additional ADL Goal #1: Pt will independently verbalize 3 energy conservation techniques for ADL  Plan Discharge plan needs to be updated    Co-evaluation                 End of Session Equipment Utilized During Treatment: Rolling walker;Oxygen   Activity Tolerance Patient tolerated treatment well   Patient Left in chair;with call bell/phone within reach   Nurse Communication Mobility status        Time: 1245-1310 OT Time Calculation (min): 25 min  Charges: OT General Charges $OT Visit: 1 Procedure OT Treatments $Self Care/Home Management : 23-37 mins  Hope Budds 03/05/2016, 1:21 PM  [phone]

## 2016-03-05 NOTE — Clinical Social Work Placement (Signed)
   CLINICAL SOCIAL WORK PLACEMENT  NOTE  Date:  03/05/2016  Patient Details  Name: Peyton BottomsJames Bergsma MRN: [id number] Date of Birth: 10/17/1951  Clinical Social Work is seeking post-discharge placement for this patient at the Skilled  Nursing Facility level of care (*CSW will initial, date and re-position this form in  chart as items are completed):  Yes   Patient/family provided with [facility] Clinical Social Work Department's list of facilities offering this level of care within the geographic area requested by the patient (or if unable, by the patient's family).  Yes   Patient/family informed of their freedom to choose among providers that offer the needed level of care, that participate in Medicare, Medicaid or managed care program needed by the patient, have an available bed and are willing to accept the patient.  Yes   Patient/family informed of [facility]'s ownership interest in [facility] and [facility], as well as of the fact that they are under no obligation to receive care at these facilities.  PASRR submitted to EDS on 03/05/16     PASRR number received on 03/05/16     Existing PASRR number confirmed on       FL2 transmitted to all facilities in geographic area requested by pt/family on 03/05/16     FL2 transmitted to all facilities within larger geographic area on       Patient informed that his/her managed care company has contracts with or will negotiate with certain facilities, including the following:            Patient/family informed of bed offers received.  Patient chooses bed at       Physician recommends and patient chooses bed at      Patient to be transferred to   on  .  Patient to be transferred to facility by       Patient family notified on   of transfer.  Name of family member notified:        PHYSICIAN Please sign FL2     Additional Comment:    _______________________________________________ Darleene CleaverAnterhaus, Pacer Dorn R, LCSWA 03/05/2016,  5:35 PM

## 2016-03-05 NOTE — Progress Notes (Signed)
Placed patient on BIPAP for the night with IPAP=8cm and EPAP=4cm. Oxygen set at 2lpm

## 2016-03-05 NOTE — Progress Notes (Signed)
Progress Note    Dylan Kennedy  ONG:[id number] DOB: 1951/09/03  DOA: 02/23/2016 PCP: No primary care provider on file.   Outpatient Specialists:   None.   Brief Narrative:   Dylan Kennedy is an 65 y.o. male with a PMH of hypertension, hyperlipidemia, morbid obesity and gout who was admitted by the critical care team on 02/23/16 with acute hypoxic and hypercarbic respiratory failure secondary to multifocal pneumonia requiring ventilatory support/intubation from 02/23/16-02/29/16.  Assessment/Plan:   Principal Problem:   Acute hypercapnic respiratory failure (HCC) secondary to multifocal pneumonia Intubated from 02/23/16-02/29/16. He continues to require nocturnal BiPAP as needed. Has completed a course of antibiotics with Rocephin/azithromycin. Blood and sputum cultures were negative. Respiratory virus panel was negative. Continue pulmonary toilet. Will need home BiPAP set up an outpatient and sleep study arranged.  Active Problems:   CKD (chronic kidney disease) stage 3, GFR 30-59 ml/min Creatinine improved with holding Lasix.    Hypokalemia Repleted.    Morbid obesity with BMI of 40.0-44.9, adult    Essential hypertension Hold Lasix. TTE w/ EF 65-70%, no wall motion abnormalities, normal PA pressure.    Candidal intertrigo Continue nystatin topical powder.  Family Communication/Anticipated D/C date and plan/Code Status   DVT prophylaxis: Ambulatory. Code Status: Full Code.  Family Communication: Sister and niece at bedside 03/02/16, no family present today. Disposition Plan: CIR vs SNF pending insurance approval and patient compliance.   Medical Consultants:    Pulmonology  Physical Medicine and Rehabilitation   Procedures:   2-D echo  Anti-Infectives:   Vanc 4/14>> 4/14 Zosyn 4/14 > 4/14  Ceftriaxone 4/14 >4/20 Azithro 4/14>4/18  Subjective:   Dylan Kennedy denies current dyspnea.  Sitting up in chair.  No complaints of pain.  No nausea or  vomiting.  Says his bowels are moving.  Objective:    Filed Vitals:   03/04/16 1356 03/04/16 2110 03/04/16 2233 03/05/16 0616  BP: 132/80 129/75  137/73  Pulse: 77 82 85 64  Temp: 97.2 F (36.2 C) 98.9 F (37.2 C)  98.1 F (36.7 C)  TempSrc: Oral Oral  Oral  Resp: Height:      Weight:      SpO2: 98% 98% 97% 95%    Intake/Output Summary (Last 24 hours) at 03/05/16 0905 Last data filed at 03/05/16 4401  Gross per 24 hour  Intake    840 ml  Output    800 ml  Net     40 ml   Filed Weights   02/27/16 0426 03/01/16 0425 03/01/16 2041  Weight: 151.048 kg (333 lb) 156 kg (343 lb 14.7 oz) 135.762 kg (299 lb 4.8 oz)    Exam: General exam: Appears calm and comfortable. Sitting up in chair with oxygen on. Respiratory system: Clear to auscultation but diminished. Respiratory effort normal. Cardiovascular system: S1 & S2 heard, RRR. No JVD, murmurs, rubs, gallops or clicks. Trace pedal edema. Gastrointestinal system: Abdomen is nondistended, obese and nontender. No organomegaly or masses felt. Normal bowel sounds heard. Central nervous system: Sleepy. No focal neurological deficits. Extremities: Symmetric 5 x 5 power. No clubbing, or cyanosis. Skin: No rashes, lesions or ulcers Psychiatry: Judgement and insight appear normal. Mood & affect flat.   Data Reviewed:   I have personally reviewed following labs and imaging studies:  Labs: Basic Metabolic Panel:  Recent Labs Lab 02/28/16 0505 02/29/16 0500 03/01/16 0258 03/02/16 0523 03/03/16 0617  NA 143 142 143 140 138  K 3.8 3.7  3.7 3.8 4.1  CL 103 100* 99* 94* 97*  CO2 31 30 31  34* 26  GLUCOSE 99 98 90 94 93  BUN 32* 34* 35* 37* 28*  CREATININE 1.06 1.07 1.28* 1.41* 1.17  CALCIUM 9.3 9.3 9.0 9.0 9.0  MG  --  2.4  --   --   --   PHOS  --  4.3  --   --   --    GFR Estimated Creatinine Clearance: 88.4 mL/min (by C-G formula based on Cr of 1.17).  CBC:  Recent Labs Lab 02/29/16 0500  WBC 8.6  HGB  12.2*  HCT 40.5  MCV 87.3  PLT 239   CBG:  Recent Labs Lab 03/04/16 0723 03/04/16 1148 03/04/16 1708 03/04/16 2129 03/05/16 0729  GLUCAP 97 107* 93 109* 92   Sepsis Labs:  Recent Labs Lab 02/29/16 0500  WBC 8.6   Microbiology Recent Results (from the past 240 hour(s))  Respiratory virus panel     Status: None   Collection Time: 02/24/16 11:31 AM  Result Value Ref Range Status   Respiratory Syncytial Virus A Negative Negative Final   Respiratory Syncytial Virus B Negative Negative Final   Influenza A Negative Negative Final   Influenza B Negative Negative Final   Parainfluenza 1 Negative Negative Final   Parainfluenza 2 Negative Negative Final   Parainfluenza 3 Negative Negative Final   Metapneumovirus Negative Negative Final   Rhinovirus Negative Negative Final   Adenovirus Negative Negative Final    Comment: (NOTE) Performed At: [facility] [address] [place], [place] [id number] Mila HomerHancock William F MD NW:[id number]:[phone]   Culture, respiratory (NON-Expectorated)     Status: None   Collection Time: 02/26/16  4:07 AM  Result Value Ref Range Status   Specimen Description TRACHEAL ASPIRATE  Final   Special Requests NONE  Final   Gram Stain   Final    ABUNDANT WBC PRESENT,BOTH PMN AND MONONUCLEAR RARE SQUAMOUS EPITHELIAL CELLS PRESENT NO ORGANISMS SEEN [facility]formed at [facility]    Culture   Final    NORMAL OROPHARYNGEAL FLORA Performed at [facility]    Report Status 02/28/2016 FINAL  Final    Radiology: No results found.  Medications:   . docusate  200 mg Oral Daily  . nystatin   Topical TID  . pantoprazole  40 mg Oral Daily  . polyethylene glycol  17 g Oral Daily   Continuous Infusions:   Time spent: 25 minutes.    LOS: 11 days   Dylan Kennedy  [facility] Pager [phone]. If unable to reach me by pager, please call my cell phone at [phone].  *Please refer to [email or web address], password [email or web address] to get updated schedule  on who will round on this patient, as hospitalists switch teams weekly. If 7PM-7AM, please contact night-coverage at [email or web address], password [email or web address] for any overnight needs.  03/05/2016, 9:05 AM

## 2016-03-05 NOTE — Progress Notes (Signed)
I met with pt at bedside to discuss his progress with therapy and his plans for d/c. He is doing well functionally to not need an inpt rehab admission at this time nor have I received insurance approval at this time. Pt states his sister feels he can not return to his own home due to the condition of his home. Per pt, sister states he can not come to stay with her either. I discussed SNF rehab until arrangements could be made for d/c and that I would alert SW of dispo needs. I discussed with RN CM and SW. We will sign off . [phone]

## 2016-03-06 LAB — GLUCOSE, CAPILLARY
GLUCOSE-CAPILLARY: 100 mg/dL — AB (ref 65–99)
Glucose-Capillary: 92 mg/dL (ref 65–99)
Glucose-Capillary: 112 mg/dL — ABNORMAL HIGH (ref 65–99)
Glucose-Capillary: 97 mg/dL (ref 65–99)

## 2016-03-06 LAB — BASIC METABOLIC PANEL WITH GFR
Anion gap: 9 (ref 5–15)
BUN: 18 mg/dL (ref 6–20)
CO2: 31 mmol/L (ref 22–32)
Calcium: 9 mg/dL (ref 8.9–10.3)
Chloride: 98 mmol/L — ABNORMAL LOW (ref 101–111)
Creatinine, Ser: 1.34 mg/dL — ABNORMAL HIGH (ref 0.61–1.24)
GFR calc Af Amer: >60 mL/min
GFR calc non Af Amer: 55 mL/min — ABNORMAL LOW
Glucose, Bld: 99 mg/dL (ref 65–99)
Potassium: 4.6 mmol/L (ref 3.5–5.1)
Sodium: 138 mmol/L (ref 135–145)

## 2016-03-06 MED ORDER — ENOXAPARIN SODIUM 40 MG/0.4ML ~~LOC~~ SOLN
40.0000 mg | SUBCUTANEOUS | Status: DC
  Administered 2016-03-06 – 2016-03-07 (×2): 40 mg via SUBCUTANEOUS
  Filled 2016-03-06 (×2): qty 0.4

## 2016-03-06 NOTE — Progress Notes (Signed)
PROGRESS NOTE    Dylan BottomsJames Kennedy  GNF:[id number] DOB: 04/11/1951 DOA: 02/23/2016 PCP: No primary care provider on file.   Outpatient Specialists:    Brief Narrative:  65 year old Caucasian male, morbidly obese, with documented history of hypertension and gout. Patient was admitted to the hospital on 02/23/2016 with SOB, severe and needed non re-breather; with associated chills and fever. CT chest revealed multifocal infiltrate. Patient was intubated and transferred to ICU service the following day. Patient has been extubated and transferred to hospitalist service for further care and disposition. Patient denied prior history of cigarette use. Patient has never been worked up for OSA, but reports symptoms suggestive of OSA and likely Obesity hypoventilation syndrome. ECHO finding is noted, and normal EF is reported, no mention of diastolic function status, with LVH. RVSP was also reported to be normal. Patient remains on BiPAP at night with supplemental oxygen via nasal canula 2L/min. Disposition is awaited (likely SNF).  Patient is seen today and has no new complaints. No fever or chills. No SOB or chest pain. No coughing.  Assessment & Plan:   Principal Problem:   Acute hypercapnic respiratory failure (HCC) Active Problems:   Community acquired pneumonia   CKD (chronic kidney disease) stage 3, GFR 30-59 ml/min   Hypokalemia   Essential hypertension   Candidal intertrigo   Endotracheally intubated   Morbid obesity with BMI of 40.0-44.9, adult (HCC)   DVT prophylaxis:SCD Code Status: Full code Family Communication:   Disposition Plan:  Awaiting SNF placement. Consultants:   Transferred from ICU service.   Procedures:    BiPAP at night time.  Antimicrobials:   None.    Subjective: No new complaints. No fever or chills. No chest pain. No coughing.  Objective: Filed Vitals:   03/05/16 0616 03/05/16 1331 03/05/16 2125 03/06/16 0543  BP: 137/73 135/78 114/64 105/65  Pulse:  64 78 74 68  Temp: 98.1 F (36.7 C) 97.4 F (36.3 C) 98.4 F (36.9 C) 98.4 F (36.9 C)  TempSrc: Oral Oral Oral Axillary  Resp: 17 18 17 18   Height:      Weight:      SpO2: 95% 96% 97% 91%    Intake/Output Summary (Last 24 hours) at 03/06/16 0948 Last data filed at 03/06/16 0600  Gross per 24 hour  Intake   1200 ml  Output    700 ml  Net    500 ml   Filed Weights   02/27/16 0426 03/01/16 0425 03/01/16 2041  Weight: 151.048 kg (333 lb) 156 kg (343 lb 14.7 oz) 135.762 kg (299 lb 4.8 oz)    Examination:  General exam: Morbidly obese, on supplemental Oxygen via nasal canula. Appears calm and comfortable  Respiratory system: Clear to auscultation, but decreased air entry globally. Cardiovascular system: S1 & S2, with ectopic beats. No heart murmur elicited.   Gastrointestinal system: Abdomen is morbidly obese, soft and nontender. Organs are difficult to assess. Central nervous system: Alert and oriented. No focal neurological deficits. Extremities: Mild leg edema. Skin: No rashes, lesions or ulcers visualized. Psychiatry: Judgement and insight appear normal. Mood & affect appropriate.     Data Reviewed: I have personally reviewed pertinent labs and imaging studies.  CBC:  Recent Labs Lab 02/29/16 0500  WBC 8.6  HGB 12.2*  HCT 40.5  MCV 87.3  PLT 239   Basic Metabolic Panel:  Recent Labs Lab 02/29/16 0500 03/01/16 0258 03/02/16 0523 03/03/16 0617 03/06/16 0509  NA 142 143 140 138 138  K 3.7  3.7 3.8 4.1 4.6  CL 100* 99* 94* 97* 98*  CO2 30 31 34* 26 31  GLUCOSE 98 90 94 93 99  BUN 34* 35* 37* 28* 18  CREATININE 1.07 1.28* 1.41* 1.17 1.34*  CALCIUM 9.3 9.0 9.0 9.0 9.0  MG 2.4  --   --   --   --   PHOS 4.3  --   --   --   --    GFR: Estimated Creatinine Clearance: 77.2 mL/min (by C-G formula based on Cr of 1.34). Liver Function Tests: No results for input(s): AST, ALT, ALKPHOS, BILITOT, PROT, ALBUMIN in the last 168 hours. No results for input(s):  LIPASE, AMYLASE in the last 168 hours. No results for input(s): AMMONIA in the last 168 hours. Coagulation Profile: No results for input(s): INR, PROTIME in the last 168 hours. Cardiac Enzymes: No results for input(s): CKTOTAL, CKMB, CKMBINDEX, TROPONINI in the last 168 hours. BNP (last 3 results) No results for input(s): PROBNP in the last 8760 hours. HbA1C: No results for input(s): HGBA1C in the last 72 hours. CBG:  Recent Labs Lab 03/05/16 0729 03/05/16 1154 03/05/16 1717 03/05/16 2123 03/06/16 0817  GLUCAP 92 115* 88 100* 92   Lipid Profile: No results for input(s): CHOL, HDL, LDLCALC, TRIG, CHOLHDL, LDLDIRECT in the last 72 hours. Thyroid Function Tests: No results for input(s): TSH, T4TOTAL, FREET4, T3FREE, THYROIDAB in the last 72 hours. Anemia Panel: No results for input(s): VITAMINB12, FOLATE, FERRITIN, TIBC, IRON, RETICCTPCT in the last 72 hours. Urine analysis: No results found for: COLORURINE, APPEARANCEUR, LABSPEC, PHURINE, GLUCOSEU, HGBUR, BILIRUBINUR, KETONESUR, PROTEINUR, UROBILINOGEN, NITRITE, LEUKOCYTESUR Sepsis Labs: (procalcitonin:4,lacticidven:4)  ) Recent Results (from the past 240 hour(s))  Culture, respiratory (NON-Expectorated)     Status: None   Collection Time: 02/26/16  4:07 AM  Result Value Ref Range Status   Specimen Description TRACHEAL ASPIRATE  Final   Special Requests NONE  Final   Gram Stain   Final    ABUNDANT WBC PRESENT,BOTH PMN AND MONONUCLEAR RARE SQUAMOUS EPITHELIAL CELLS PRESENT NO ORGANISMS SEEN Performed at [facility]    Culture   Final    NORMAL OROPHARYNGEAL FLORA Performed at [facility]    Report Status 02/28/2016 FINAL  Final         Radiology Studies: No results found.      Scheduled Meds: . docusate  200 mg Oral Daily  . nystatin   Topical TID  . pantoprazole  40 mg Oral Daily  . polyethylene glycol  17 g Oral Daily   Continuous Infusions:    LOS: 12 days    Time  spent: Greater than 30 minutes.    Barnetta Chapel, MD [facility] Pager [phone]  If 7PM-7AM, please contact night-coverage [email or web address] Password [facility] 03/06/2016, 9:48 AM

## 2016-03-06 NOTE — Progress Notes (Signed)
Physical Therapy Treatment Patient Details Name: Dylan BottomsJames Marschke MRN: [id number] DOB: 05/18/1951 Today's Date: 03/06/2016    History of Present Illness 65 yo with history of HTN, HLD, morbid obesity, gout who came in with 4 day history of shortness of breath, worse with exertion., subjective fevers, and intermittent left sided chest soreness. His O2 sats was 63% on 3L [place]. Was on nonrebreather, and CXR shows right middle lobe atelectasis- can not exclude pneumonia. . CT of chest showed multifocal pneumonia.  Failed on bipap so was intubated 4/14 - 4/20.    PT Comments    Pt able to increase his ambulation tolerance by ambulating 300 feet with rw and supervision assist. Pt using 2L O2 throughout session and SpO2 94% after ambulation. Pt reports that he is fine with going to SNF if that is what is best. PT to follow with anticipation of D/C to SNF.   Follow Up Recommendations  SNF     Equipment Recommendations  None recommended by PT (to be addressed at next venue. )    Recommendations for Other Services       Precautions / Restrictions Precautions Precautions: Fall Precaution Comments: watch O2 Sats Restrictions Weight Bearing Restrictions: No    Mobility  Bed Mobility               General bed mobility comments: up in chair upon arrival  Transfers Overall transfer level: Needs assistance Equipment used: Rolling walker (2 wheeled) Transfers: Sit to/from Stand Sit to Stand: Supervision         General transfer comment: supervision for safety  Ambulation/Gait Ambulation/Gait assistance: Supervision Ambulation Distance (Feet): 300 Feet Assistive device: Rolling walker (2 wheeled) Gait Pattern/deviations: Step-through pattern;Trunk flexed Gait velocity: decreased   General Gait Details: Steady pattern, no loss of balance. SpO2 97% prior to ambulation and 94% upon retrun (on 2L in room and with ambulation).    Stairs            Wheelchair Mobility     Modified Rankin (Stroke Patients Only)       Balance Overall balance assessment: Needs assistance Sitting-balance support: No upper extremity supported Sitting balance-Leahy Scale: Good     Standing balance support: No upper extremity supported Standing balance-Leahy Scale: Fair Standing balance comment: able to stand static without loss of balance                    Cognition Arousal/Alertness: Awake/alert Behavior During Therapy: WFL for tasks assessed/performed Overall Cognitive Status: Within Functional Limits for tasks assessed                      Exercises      General Comments        Pertinent Vitals/Pain Pain Assessment: No/denies pain    Home Living                      Prior Function            PT Goals (current goals can now be found in the care plan section) Acute Rehab PT Goals Patient Stated Goal: to return home PT Goal Formulation: With patient Time For Goal Achievement: 03/12/16 Potential to Achieve Goals: Good Progress towards PT goals: Progressing toward goals    Frequency  Min 2X/week    PT Plan Discharge plan needs to be updated;Frequency needs to be updated    Co-evaluation             End  of Session Equipment Utilized During Treatment: Gait belt;Oxygen Activity Tolerance: Patient tolerated treatment well Patient left: in chair;with call bell/phone within reach     Time: 1144-1201 PT Time Calculation (min) (ACUTE ONLY): 17 min  Charges:  $Gait Training: 8-22 mins                    G Codes:      Christiane Ha, PT, CSCS Pager [phone] Office [phone]  03/06/2016, 12:11 PM

## 2016-03-06 NOTE — Clinical Social Work Note (Signed)
CSW spoke to patient and his sister to present bed offers, and they would like to go to [facility] SNF/ALF.  CSW contacted SNF and they can take patient once he is ready for discharge.  CSW to continue to follow patient's progress.  Ervin KnackEric R. Morse Brueggemann, MSW, Theresia MajorsLCSWA [phone] 03/06/2016 12:46 PM

## 2016-03-06 NOTE — Progress Notes (Signed)
Placed patient on bipap for the night with IPAP=8cm and EPAP=4cm. Oxygen set at 2lpm

## 2016-03-07 DIAGNOSIS — E876 Hypokalemia: Secondary | ICD-10-CM

## 2016-03-07 DIAGNOSIS — G4733 Obstructive sleep apnea (adult) (pediatric): Secondary | ICD-10-CM

## 2016-03-07 DIAGNOSIS — E662 Morbid (severe) obesity with alveolar hypoventilation: Secondary | ICD-10-CM

## 2016-03-07 LAB — GLUCOSE, CAPILLARY
GLUCOSE-CAPILLARY: 89 mg/dL (ref 65–99)
Glucose-Capillary: 111 mg/dL — ABNORMAL HIGH (ref 65–99)

## 2016-03-07 MED ORDER — DOCUSATE SODIUM 50 MG/5ML PO LIQD: 200.0000 mg | Freq: Every day | ORAL | Status: DC

## 2016-03-07 MED ORDER — NYSTATIN 100000 UNIT/GM EX POWD: Freq: Three times a day (TID) | CUTANEOUS | Status: DC

## 2016-03-07 MED ORDER — POLYETHYLENE GLYCOL 3350 17 G PO PACK: 17.0000 g | PACK | Freq: Every day | ORAL | Status: DC

## 2016-03-07 NOTE — Discharge Summary (Signed)
Physician Discharge Summary  Patient ID: Dylan Kennedy MRN: [id number] DOB/AGE: 15-Jun-1951 65 y.o.  Admit date: 02/23/2016 Discharge date: 03/07/2016  Admission Diagnoses:  Discharge Diagnoses:  Principal Problem:   Acute hypercapnic respiratory failure (HCC) Active Problems:   Community acquired pneumonia   CKD (chronic kidney disease) stage 3, GFR 30-59 ml/min   Hypokalemia   Essential hypertension   Candidal intertrigo   Endotracheally intubated   Morbid obesity with BMI of 40.0-44.9, adult (HCC)   Discharged Condition: Stable.  Hospital Course: 65 year old Caucasian male, morbidly obese, with documented history of hypertension and gout. Patient was admitted to the hospital on 02/23/2016 with SOB, severe and needed non re-breather; with associated chills and fever. CT chest revealed multifocal infiltrate. Patient was intubated and transferred to ICU service the following day. Patient has completed course of antibiotics. Cultures are non revealing. Patient was extubated and transferred to hospitalist service for further care. Patient has continued to use BiPAP 8/4 at night time with 2L of supplemental oxygen (and should maintain same on discharge). Patient will benefit from formal sleep studies on discharge (will defer to the PCP and the team that will look after patient on discharge.   Patient has remained stable. Patient denied prior history of cigarette use. Patient has never been worked up for OSA, but reports symptoms suggestive of OSA and likely Obesity hypoventilation syndrome. ECHO finding is noted, and normal EF is reported, no mention of diastolic function status; with LVH. RVSP was also reported to be normal. Patient remains on BiPAP at night with supplemental oxygen via nasal canula 2L/min. Disposition will be to SNF. Kindly continue PT/OT on discharge.  Consults: Critical care team assumed care of patient while patient was in ICU.  Significant Diagnostic Studies: ECHO. CT  angio chest.  Discharge Medication - Kindly refer to Med. Rec.  Discharge Exam: Blood pressure 111/65, pulse 67, temperature 97.8 F (36.6 C), temperature source Axillary, resp. rate 18, height  (1.753 m), weight 135.762 kg (299 lb 4.8 oz), SpO2 97 %.   Examination:  General exam: Morbidly obese, on supplemental Oxygen via nasal canula. Appears calm and comfortable  Respiratory system: Clear to auscultation, but decreased air entry globally. Cardiovascular system: S1 & S2, with ectopic beats. No heart murmur elicited.  Gastrointestinal system: Abdomen is morbidly obese, soft and nontender. Organs are difficult to assess. Central nervous system: Alert and oriented. No focal neurological deficits. Extremities: Mild leg edema. Skin: No rashes, lesions or ulcers visualized. Psychiatry: Judgement and insight appear normal. Mood & affect appropriate  Disposition: 02-Transferred to [facility]  Discharge Instructions    Activity as tolerated - No restrictions    Complete by:  As directed   Activity as PT/OT     Call MD for:  temperature >100.4    Complete by:  As directed      Diet - low sodium heart healthy    Complete by:  As directed             Medication List    STOP taking these medications        acetaminophen 500 MG tablet  Commonly known as:  TYLENOL     lisinopril 10 MG tablet  Commonly known as:  PRINIVIL,ZESTRIL     lisinopril-hydrochlorothiazide 20-12.5 MG tablet  Commonly known as:  PRINZIDE,ZESTORETIC      TAKE these medications        allopurinol 300 MG tablet  Commonly known as:  ZYLOPRIM  Take 300 mg  by mouth every evening.     cholecalciferol 1000 units tablet  Commonly known as:  VITAMIN D  Take 2,000 Units by mouth every evening.     docusate 50 MG/5ML liquid  Commonly known as:  COLACE  Take 20 mLs (200 mg total) by mouth daily.     nystatin powder  Commonly known as:  nystatin  Apply topically 3 (three) times daily.      pantoprazole 40 MG tablet  Commonly known as:  PROTONIX  Take 40 mg by mouth every evening.     polyethylene glycol packet  Commonly known as:  MIRALAX / GLYCOLAX  Take 17 g by mouth daily.           Follow-up Information    Follow up with [facility] SNF/[facility].   Specialties:  Skilled Holiday representativeursing Facility, Assisted Living Facility   Contact information:   [address] [place] [id number] [phone]      Signed: Barnetta ChapelSylvester I Macrina Lehnert 03/07/2016, 9:12 AM

## 2016-03-07 NOTE — Discharge Planning (Signed)
Patient discharged to SNF in stable condition.  

## 2016-04-05 ENCOUNTER — Ambulatory Visit
Admission: EM | Admit: 2016-04-05 | Discharge: 2016-04-05 | Disposition: A | Discharge status: Home / Self Care | Payer: No Typology Code available for payment source | Attending: Internal Medicine | Admitting: Internal Medicine

## 2016-04-05 ENCOUNTER — Encounter: Payer: Self-pay | Admitting: Emergency Medicine

## 2016-04-05 DIAGNOSIS — Z6841 Body Mass Index (BMI) 40.0 and over, adult: Secondary | ICD-10-CM | POA: Insufficient documentation

## 2016-04-05 DIAGNOSIS — N390 Urinary tract infection, site not specified: Secondary | ICD-10-CM | POA: Diagnosis not present

## 2016-04-05 DIAGNOSIS — R350 Frequency of micturition: Secondary | ICD-10-CM | POA: Diagnosis present

## 2016-04-05 DIAGNOSIS — K219 Gastro-esophageal reflux disease without esophagitis: Secondary | ICD-10-CM | POA: Insufficient documentation

## 2016-04-05 DIAGNOSIS — N189 Chronic kidney disease, unspecified: Secondary | ICD-10-CM | POA: Diagnosis not present

## 2016-04-05 DIAGNOSIS — I129 Hypertensive chronic kidney disease with stage 1 through stage 4 chronic kidney disease, or unspecified chronic kidney disease: Secondary | ICD-10-CM | POA: Diagnosis not present

## 2016-04-05 LAB — URINALYSIS COMPLETE WITH MICROSCOPIC (ARMC ONLY)
Bilirubin Urine: NEGATIVE
GLUCOSE, UA: NEGATIVE mg/dL
KETONES UR: NEGATIVE mg/dL
Nitrite: NEGATIVE
PROTEIN: 30 mg/dL — AB
Specific Gravity, Urine: 1.015 (ref 1.005–1.030)
pH: 5.5 (ref 5.0–8.0)

## 2016-04-05 MED ORDER — NITROFURANTOIN MONOHYD MACRO 100 MG PO CAPS: 100.0000 mg | ORAL_CAPSULE | Freq: Two times a day (BID) | ORAL | Status: AC

## 2016-04-05 NOTE — Discharge Instructions (Signed)
Please increase fluid intake and void frequently to keep rinsing the bladder. Some people find cranberry juice with water helpful Empty frequently-- Take your antibiotics twice daily for 7 days. Dr. Sampson GoonFitzgerald will recheck when he sees you--  please report more quickly than your currently  scheduled appointment if you develop questions ro concerns  Urinary Tract Infection Urinary tract infections (UTIs) can develop anywhere along your urinary tract. Your urinary tract is your body's drainage system for removing wastes and extra water. Your urinary tract includes two kidneys, two ureters, a bladder, and a urethra. Your kidneys are a pair of bean-shaped organs. Each kidney is about the size of your fist. They are located below your ribs, one on each side of your spine. CAUSES Infections are caused by microbes, which are microscopic organisms, including fungi, viruses, and bacteria. These organisms are so small that they can only be seen through a microscope. Bacteria are the microbes that most commonly cause UTIs. SYMPTOMS  Symptoms of UTIs may vary by age and gender of the patient and by the location of the infection. Symptoms in young women typically include a frequent and intense urge to urinate and a painful, burning feeling in the bladder or urethra during urination. Older women and men are more likely to be tired, shaky, and weak and have muscle aches and abdominal pain. A fever may mean the infection is in your kidneys. Other symptoms of a kidney infection include pain in your back or sides below the ribs, nausea, and vomiting. DIAGNOSIS To diagnose a UTI, your caregiver will ask you about your symptoms. Your caregiver will also ask you to provide a urine sample. The urine sample will be tested for bacteria and white blood cells. White blood cells are made by your body to help fight infection. TREATMENT  Typically, UTIs can be treated with medication. Because most UTIs are caused by a bacterial  infection, they usually can be treated with the use of antibiotics. The choice of antibiotic and length of treatment depend on your symptoms and the type of bacteria causing your infection. HOME CARE INSTRUCTIONS  If you were prescribed antibiotics, take them exactly as your caregiver instructs you. Finish the medication even if you feel better after you have only taken some of the medication.  Drink enough water and fluids to keep your urine clear or pale yellow.  Avoid caffeine, tea, and carbonated beverages. They tend to irritate your bladder.  Empty your bladder often. Avoid holding urine for long periods of time.  Empty your bladder before and after sexual intercourse.  After a bowel movement, women should cleanse from front to back. Use each tissue only once. SEEK MEDICAL CARE IF:   You have back pain.  You develop a fever.  Your symptoms do not begin to resolve within 3 days. SEEK IMMEDIATE MEDICAL CARE IF:   You have severe back pain or lower abdominal pain.  You develop chills.  You have nausea or vomiting.  You have continued burning or discomfort with urination. MAKE SURE YOU:   Understand these instructions.  Will watch your condition.  Will get help right away if you are not doing well or get worse.   This information is not intended to replace advice given to you by your health care provider. Make sure you discuss any questions you have with your health care provider.   Document Released: 08/07/2005 Document Revised: 07/19/2015 Document Reviewed: 12/06/2011 Elsevier Interactive Patient Education [employer]2016 Elsevier Inc.

## 2016-04-05 NOTE — ED Notes (Signed)
Patient reports increase urinary frequency for a week.  Patient denies pain.  Patient reports low grade fevers.

## 2016-04-05 NOTE — ED Provider Notes (Signed)
CSN: [id number]     Arrival date & time 04/05/16  1401 History   First MD Initiated Contact with Patient 04/05/16 1438     Chief Complaint  Patient presents with  . Urinary Frequency   (Consider location/radiation/quality/duration/timing/severity/associated sxs/prior Treatment) HPI  65 yo M present reporting about a week of needing to void every one to two hours.  Denies nausea ,vomiting,back pain or fever Not sure if he has ever had a bladder infection. [facility] where meals are prepared for him . He has personally furnished room and bath with refrig and TV.  Pleased with situatation His own home is currently being renovated and he plans to move back. Sp[place] in early April with "double pneumonia" then [facility].  Chart review indicates April 2017  ER admit for pneumonia with ET intubation for support.required. He is pleased to feel much better now. Denies any unusual repiratory difficulty at this time. Ambulates with walker  Uses a walker for longer walks but a cane at home. Feels well but frustrated with frequent need to urinate. Denies knowledge of prostate issues. Wore a catheter while in the hospital. Denies nausea, vomiting , back pain or fever. Appetite good. Reports he feels well. Drove himself here  Past Medical History  Diagnosis Date  . Hypertension   . Gout   . GERD (gastroesophageal reflux disease)   . Morbid obesity (HCC)   . CKD (chronic kidney disease)   . Acute hypercapnic respiratory failure (HCC) 02/2016  . Pneumonia 02/2016   Past Surgical History  Procedure Laterality Date  . Tonsillectomy    . Shoulder surgery      right  . Colonoscopy     Family History  Problem Relation Age of Onset  . Breast cancer Mother   . Bone cancer Father    Social History  Substance Use Topics  . Smoking status: Never Smoker   . Smokeless tobacco: Never Used  . Alcohol Use: No    Review of Systems Review  of 10 systems negative for acute change except as referenced in HPI  Allergies  Bee venom  Home Medications   Prior to Admission medications   Medication Sig Start Date End Date Taking? Authorizing Provider  lisinopril-hydrochlorothiazide (PRINZIDE,ZESTORETIC) 20-12.5 MG tablet Take 1 tablet by mouth daily.   Yes Historical Provider, MD  allopurinol (ZYLOPRIM) 300 MG tablet Take 300 mg by mouth every evening.    Historical Provider, MD  cholecalciferol (VITAMIN D) 1000 units tablet Take 2,000 Units by mouth every evening.    Historical Provider, MD  docusate (COLACE) 50 MG/5ML liquid Take 20 mLs (200 mg total) by mouth daily. 03/07/16   Barnetta ChapelSylvester I Ogbata, MD  nitrofurantoin, macrocrystal-monohydrate, (MACROBID) 100 MG capsule Take 1 capsule (100 mg total) by mouth 2 (two) times daily. 04/05/16 04/12/16  Rae HalstedLaurie W Eluzer [place], PA-C  nystatin (NYSTATIN) powder Apply topically 3 (three) times daily. 03/07/16   Barnetta ChapelSylvester I Ogbata, MD  pantoprazole (PROTONIX) 40 MG tablet Take 40 mg by mouth every evening.    Historical Provider, MD  polyethylene glycol (MIRALAX / GLYCOLAX) packet Take 17 g by mouth daily. 03/07/16   Barnetta ChapelSylvester I Ogbata, MD   Meds Ordered and Administered this Visit  Medications - No data to display  BP 161/77 mmHg  Pulse 83  Temp(Src) 99.3 F (37.4 C) (Tympanic)  Resp 17  Ht 5\' 9"  (1.753 m)  Wt 299 lb (135.626 kg)  BMI  44.13 kg/m2  SpO2 93% No data found.   Physical Exam   Constitutional -alert and oriented,well appearing and in no acute distress,  General: Morbidly obese WM seated in chair, walker available Head-atraumatic, normocephalic Eyes- conjunctiva normal, EOMI ,conjugate gaze Ears: grossly normal hearing Nose- no congestion or rhinorrhea Mouth/throat- mucous membranes moist , Neck- supple without glandular enlargement CV- regular rate and rhythmn Resp-no distress, normal respiratory effort,clear to auscultation bilaterally-states he feels comfortable, respirations  his" normal"  GI- abdomen large,patient seated, no discomfort with palpation, nomasses palpable Back - no CVAT GU-  not examined, denies current catheter- reports he had one while inpatient [facility]- non tender, normal ROM, ambulatory with walker, no gait instability,on /off table solo Neuro- normal speech and language, no gross focal neurological deficit appreciated,  Skin-warm,dry ,intact;  Psych-mood and affect grossly normal; speech and behavior grossly normal  ED Course  Procedures (including critical care time)  Labs Review Labs Reviewed  URINALYSIS COMPLETEWITH MICROSCOPIC ([facility] ONLY) - Abnormal; Notable for the following:    APPearance CLOUDY (*)    Hgb urine dipstick 2+ (*)    Protein, ur 30 (*)    Leukocytes, UA 2+ (*)    Bacteria, UA MANY (*)    Squamous Epithelial / LPF 0-5 (*)    All other components within normal limits  URINE CULTURE    Imaging Review No results found.  Discussed patient with Dr Dayton Scrape - reviewed patient, VS and plan. Patient reports feeling comfortable,except for frequency and Is moving about the room and hall without undue difficulty. OK for discharge-   MDM   1. Urinary tract infection after period of immobility    Plan: Test results and diagnosis reviewed with patient/  Rx as per orders;  benefits, risks, potential side effects reviewed .Marland Kitchen Encourage increased PO fluids and frequent emptying Seek additional medical care if symptoms worsen or are not improving Plan f/u and re-evaluate next week with Dr Sampson Goon at routine exam Call in 3 days for culture report  Discharge Medication List as of 04/05/2016  3:02 PM    START taking these medications   Details  nitrofurantoin, macrocrystal-monohydrate, (MACROBID) 100 MG capsule Take 1 capsule (100 mg total) by mouth 2 (two) times daily., Starting 04/05/2016, Until Fri 04/12/16, Normal          Rae Halsted, PA-C 04/05/16 [phone]

## 2016-04-08 ENCOUNTER — Telehealth: Payer: Self-pay | Admitting: Emergency Medicine

## 2016-04-08 LAB — URINE CULTURE

## 2016-05-07 ENCOUNTER — Inpatient Hospital Stay
Admission: EM | Admit: 2016-05-07 | Discharge: 2016-05-11 | DRG: 190 | Disposition: A | Discharge status: Skilled Nursing Facility | Payer: No Typology Code available for payment source | Attending: Internal Medicine | Admitting: Internal Medicine

## 2016-05-07 ENCOUNTER — Encounter: Payer: Self-pay | Admitting: *Deleted

## 2016-05-07 ENCOUNTER — Emergency Department: Payer: No Typology Code available for payment source

## 2016-05-07 DIAGNOSIS — Z9114 Patient's other noncompliance with medication regimen: Secondary | ICD-10-CM | POA: Diagnosis not present

## 2016-05-07 DIAGNOSIS — J441 Chronic obstructive pulmonary disease with (acute) exacerbation: Secondary | ICD-10-CM | POA: Diagnosis present

## 2016-05-07 DIAGNOSIS — N183 Chronic kidney disease, stage 3 (moderate): Secondary | ICD-10-CM | POA: Diagnosis present

## 2016-05-07 DIAGNOSIS — I130 Hypertensive heart and chronic kidney disease with heart failure and stage 1 through stage 4 chronic kidney disease, or unspecified chronic kidney disease: Secondary | ICD-10-CM | POA: Diagnosis present

## 2016-05-07 DIAGNOSIS — R0600 Dyspnea, unspecified: Secondary | ICD-10-CM

## 2016-05-07 DIAGNOSIS — K219 Gastro-esophageal reflux disease without esophagitis: Secondary | ICD-10-CM | POA: Diagnosis present

## 2016-05-07 DIAGNOSIS — J9621 Acute and chronic respiratory failure with hypoxia: Secondary | ICD-10-CM | POA: Diagnosis present

## 2016-05-07 DIAGNOSIS — E8809 Other disorders of plasma-protein metabolism, not elsewhere classified: Secondary | ICD-10-CM | POA: Diagnosis present

## 2016-05-07 DIAGNOSIS — I129 Hypertensive chronic kidney disease with stage 1 through stage 4 chronic kidney disease, or unspecified chronic kidney disease: Secondary | ICD-10-CM | POA: Diagnosis present

## 2016-05-07 DIAGNOSIS — G4730 Sleep apnea, unspecified: Secondary | ICD-10-CM | POA: Diagnosis present

## 2016-05-07 DIAGNOSIS — D696 Thrombocytopenia, unspecified: Secondary | ICD-10-CM | POA: Diagnosis present

## 2016-05-07 DIAGNOSIS — Z9981 Dependence on supplemental oxygen: Secondary | ICD-10-CM | POA: Diagnosis not present

## 2016-05-07 DIAGNOSIS — I5031 Acute diastolic (congestive) heart failure: Secondary | ICD-10-CM | POA: Diagnosis present

## 2016-05-07 DIAGNOSIS — J9811 Atelectasis: Secondary | ICD-10-CM | POA: Diagnosis present

## 2016-05-07 DIAGNOSIS — J9622 Acute and chronic respiratory failure with hypercapnia: Secondary | ICD-10-CM | POA: Diagnosis present

## 2016-05-07 DIAGNOSIS — E662 Morbid (severe) obesity with alveolar hypoventilation: Secondary | ICD-10-CM | POA: Diagnosis present

## 2016-05-07 DIAGNOSIS — Z79899 Other long term (current) drug therapy: Secondary | ICD-10-CM | POA: Diagnosis not present

## 2016-05-07 DIAGNOSIS — I5033 Acute on chronic diastolic (congestive) heart failure: Secondary | ICD-10-CM | POA: Diagnosis not present

## 2016-05-07 DIAGNOSIS — I509 Heart failure, unspecified: Secondary | ICD-10-CM

## 2016-05-07 DIAGNOSIS — Z91030 Bee allergy status: Secondary | ICD-10-CM

## 2016-05-07 DIAGNOSIS — Z6841 Body Mass Index (BMI) 40.0 and over, adult: Secondary | ICD-10-CM | POA: Diagnosis not present

## 2016-05-07 DIAGNOSIS — E874 Mixed disorder of acid-base balance: Secondary | ICD-10-CM

## 2016-05-07 DIAGNOSIS — M109 Gout, unspecified: Secondary | ICD-10-CM | POA: Diagnosis present

## 2016-05-07 DIAGNOSIS — G4733 Obstructive sleep apnea (adult) (pediatric): Secondary | ICD-10-CM | POA: Diagnosis not present

## 2016-05-07 DIAGNOSIS — E785 Hyperlipidemia, unspecified: Secondary | ICD-10-CM | POA: Diagnosis present

## 2016-05-07 DIAGNOSIS — R0603 Acute respiratory distress: Secondary | ICD-10-CM

## 2016-05-07 HISTORY — DX: Other obesity due to excess calories: E66.09

## 2016-05-07 HISTORY — DX: Hyperlipidemia, unspecified: E78.5

## 2016-05-07 LAB — COMPREHENSIVE METABOLIC PANEL
ALK PHOS: 64 U/L (ref 38–126)
ALT: 11 U/L — ABNORMAL LOW (ref 17–63)
ANION GAP: 4 — AB (ref 5–15)
AST: 21 U/L (ref 15–41)
Albumin: 3.4 g/dL — ABNORMAL LOW (ref 3.5–5.0)
BILIRUBIN TOTAL: 1 mg/dL (ref 0.3–1.2)
BUN: 22 mg/dL — ABNORMAL HIGH (ref 6–20)
CALCIUM: 8.6 mg/dL — AB (ref 8.9–10.3)
CO2: 40 mmol/L — ABNORMAL HIGH (ref 22–32)
Chloride: 99 mmol/L — ABNORMAL LOW (ref 101–111)
Creatinine, Ser: 1.18 mg/dL (ref 0.61–1.24)
GFR calc non Af Amer: >60 mL/min (ref >60)
Glucose, Bld: 98 mg/dL (ref 65–99)
Potassium: 4.7 mmol/L (ref 3.5–5.1)
Sodium: 143 mmol/L (ref 135–145)
TOTAL PROTEIN: 6.5 g/dL (ref 6.5–8.1)

## 2016-05-07 LAB — BLOOD GAS, ARTERIAL
Acid-Base Excess: 14.4 mmol/L — ABNORMAL HIGH (ref 0.0–3.0)
Bicarbonate: 42.9 mEq/L — ABNORMAL HIGH (ref 21.0–28.0)
DELIVERY SYSTEMS: POSITIVE
EXPIRATORY PAP: 6
FIO2: 50
INSPIRATORY PAP: 18
O2 Saturation: 98.5 %
PCO2 ART: 76 mmHg — AB (ref 32.0–48.0)
PO2 ART: 119 mmHg — AB (ref 83.0–108.0)
Patient temperature: 37
pH, Arterial: 7.36 (ref 7.350–7.450)

## 2016-05-07 LAB — CBC
HEMATOCRIT: 33.9 % — AB (ref 40.0–52.0)
HEMOGLOBIN: 11.2 g/dL — AB (ref 13.0–18.0)
MCH: 27.4 pg (ref 26.0–34.0)
MCHC: 33 g/dL (ref 32.0–36.0)
MCV: 83 fL (ref 80.0–100.0)
Platelets: 131 10*3/uL — ABNORMAL LOW (ref 150–440)
RBC: 4.08 MIL/uL — ABNORMAL LOW (ref 4.40–5.90)
RDW: 19.5 % — ABNORMAL HIGH (ref 11.5–14.5)
WBC: 6.3 10*3/uL (ref 3.8–10.6)

## 2016-05-07 LAB — BRAIN NATRIURETIC PEPTIDE: B NATRIURETIC PEPTIDE 5: 212 pg/mL — AB (ref 0.0–100.0)

## 2016-05-07 LAB — MRSA PCR SCREENING: MRSA BY PCR: NEGATIVE

## 2016-05-07 LAB — TROPONIN I: Troponin I: <0.03 ng/mL (ref <0.03)

## 2016-05-07 MED ORDER — AZITHROMYCIN 250 MG PO TABS
500.0000 mg | ORAL_TABLET | Freq: Every day | ORAL | Status: AC
  Administered 2016-05-07: 500 mg via ORAL
  Filled 2016-05-07: qty 2

## 2016-05-07 MED ORDER — PREDNISONE 20 MG PO TABS
40.0000 mg | ORAL_TABLET | Freq: Every day | ORAL | Status: DC
  Administered 2016-05-08: 40 mg via ORAL
  Filled 2016-05-07: qty 2

## 2016-05-07 MED ORDER — ENOXAPARIN SODIUM 40 MG/0.4ML ~~LOC~~ SOLN: 30.0000 mg | SUBCUTANEOUS | Status: DC

## 2016-05-07 MED ORDER — PIPERACILLIN-TAZOBACTAM 3.375 G IVPB
INTRAVENOUS | Status: AC
  Administered 2016-05-07: 3.375 g via INTRAVENOUS
  Filled 2016-05-07: qty 50

## 2016-05-07 MED ORDER — ALLOPURINOL 100 MG PO TABS
300.0000 mg | ORAL_TABLET | Freq: Every day | ORAL | Status: DC
  Administered 2016-05-08 – 2016-05-11 (×4): 300 mg via ORAL
  Filled 2016-05-07: qty 1
  Filled 2016-05-07 (×3): qty 3

## 2016-05-07 MED ORDER — AZITHROMYCIN 250 MG PO TABS
250.0000 mg | ORAL_TABLET | Freq: Every day | ORAL | Status: AC
  Administered 2016-05-08 – 2016-05-11 (×4): 250 mg via ORAL
  Filled 2016-05-07 (×4): qty 1

## 2016-05-07 MED ORDER — SODIUM CHLORIDE 0.9 % IV SOLN: 250.0000 mL | INTRAVENOUS | Status: DC | PRN

## 2016-05-07 MED ORDER — IPRATROPIUM-ALBUTEROL 0.5-2.5 (3) MG/3ML IN SOLN
3.0000 mL | RESPIRATORY_TRACT | Status: DC
  Administered 2016-05-07 – 2016-05-11 (×23): 3 mL via RESPIRATORY_TRACT
  Filled 2016-05-07 (×22): qty 3

## 2016-05-07 MED ORDER — LISINOPRIL 10 MG PO TABS
10.0000 mg | ORAL_TABLET | Freq: Every day | ORAL | Status: DC
  Administered 2016-05-08 – 2016-05-11 (×4): 10 mg via ORAL
  Filled 2016-05-07 (×4): qty 1

## 2016-05-07 MED ORDER — CHLORHEXIDINE GLUCONATE 0.12 % MT SOLN
15.0000 mL | Freq: Two times a day (BID) | OROMUCOSAL | Status: DC
  Administered 2016-05-08 – 2016-05-10 (×7): 15 mL via OROMUCOSAL
  Filled 2016-05-07 (×6): qty 15

## 2016-05-07 MED ORDER — PIPERACILLIN-TAZOBACTAM 3.375 G IVPB 30 MIN
3.3750 g | Freq: Once | INTRAVENOUS | Status: AC
  Administered 2016-05-07: 3.375 g via INTRAVENOUS

## 2016-05-07 MED ORDER — CETYLPYRIDINIUM CHLORIDE 0.05 % MT LIQD
7.0000 mL | Freq: Two times a day (BID) | OROMUCOSAL | Status: DC
  Administered 2016-05-08 – 2016-05-11 (×6): 7 mL via OROMUCOSAL

## 2016-05-07 MED ORDER — METHYLPREDNISOLONE SODIUM SUCC 125 MG IJ SOLR
60.0000 mg | Freq: Once | INTRAMUSCULAR | Status: AC
  Administered 2016-05-07: 60 mg via INTRAVENOUS
  Filled 2016-05-07: qty 2

## 2016-05-07 MED ORDER — ASPIRIN 81 MG PO CHEW: 324.0000 mg | CHEWABLE_TABLET | ORAL | Status: AC

## 2016-05-07 MED ORDER — VANCOMYCIN HCL 10 G IV SOLR
1500.0000 mg | Freq: Once | INTRAVENOUS | Status: AC
  Administered 2016-05-07: 1500 mg via INTRAVENOUS
  Filled 2016-05-07: qty 1500

## 2016-05-07 MED ORDER — ASPIRIN 300 MG RE SUPP: 300.0000 mg | RECTAL | Status: AC

## 2016-05-07 MED ORDER — FUROSEMIDE 10 MG/ML IJ SOLN
40.0000 mg | Freq: Once | INTRAMUSCULAR | Status: AC
  Administered 2016-05-07: 40 mg via INTRAVENOUS
  Filled 2016-05-07: qty 4

## 2016-05-07 MED ORDER — BUDESONIDE 0.5 MG/2ML IN SUSP
0.5000 mg | Freq: Two times a day (BID) | RESPIRATORY_TRACT | Status: AC
  Administered 2016-05-07 – 2016-05-09 (×4): 0.5 mg via RESPIRATORY_TRACT
  Filled 2016-05-07 (×4): qty 2

## 2016-05-07 NOTE — ED Notes (Signed)
[facility] "[facility]ick [facility]." Pt has had increased fluid in legs and abd for the past month. Pt reports SOB upon exersion. Pt has had a productive cough with white and yellow sputum for the past couple days. Pt reports he was placed on 3L [place] three weeks ago after rehab for a UTI. Pt is noncomplient with medications per EMS for the past couple weeks with a hx of CHF. Pt is 87% on 3L upon arrival to ED.

## 2016-05-07 NOTE — ED Notes (Signed)
Respiratory called and informed Bipap was needed.

## 2016-05-07 NOTE — ED Notes (Signed)
Tempted to insert foley catheter without success. MD made aware. Will monitor pt for output.

## 2016-05-07 NOTE — ED Notes (Signed)
Informed RN bed ready 1723

## 2016-05-07 NOTE — ED Provider Notes (Addendum)
[facility] Emergency Department Provider Note  ____________________________________________   I have reviewed the triage vital signs and the nursing notes.   HISTORY  Chief Complaint Shortness of Breath and Leg Swelling    HPI Dylan Kennedy is a 65 y.o. male presents today with shortness of breath. Very limited history, patient on BiPAP by previous physician when I met him. He apparently lives in an independent section. Patient was admitted from double pneumonia earlier this year. According to reports, patient is on home oxygen, morbidly obese and has issues of compliance. Does not have known CHF last echo showed mild LVH thickening and he is not on diuretics. In any event, patient was hypoxic for EMS and they increased his oxygen. He was seen by the physician immediately prior to me and placed on BiPAP as he appeared to be somnolent. Patient is compliant with the BiPAP. When I first met abuse slumped down in the bed and we have repositioned and he is doing much better. Sats are 100% on 50% FiO2 at this time. Patient states he has no chest pain, he just states he's been getting short of breath. Has an occasional cough which is nonproductive. Again history is quite limited second of patient acuity and his BiPAP status.      Past Medical History  Diagnosis Date  . Hypertension   . Gout   . GERD (gastroesophageal reflux disease)   . Morbid obesity ([place])   . CKD (chronic kidney disease)   . Acute hypercapnic respiratory failure ([place]) 02/2016  . Pneumonia 02/2016  . Hyperlipidemia   . Exogenous obesity     Patient Active Problem List   Diagnosis Date Noted  . Morbid obesity with BMI of 40.0-44.9, adult ([place]) 03/02/2016  . Endotracheally intubated   . Acute hypercapnic respiratory failure ([place]) 02/24/2016  . CKD (chronic kidney disease) stage 3, GFR 30-59 ml/min 02/24/2016  . Hypokalemia 02/24/2016  . Essential hypertension 02/24/2016  . Gout 02/24/2016  .  Candidal intertrigo 02/24/2016  . Acute respiratory failure with hypoxia ([place])   . Community acquired pneumonia   . Sepsis ([place])   . Septic shock [facility])     Past Surgical History  Procedure Laterality Date  . Tonsillectomy    . Shoulder surgery      right  . Colonoscopy      Current Outpatient Rx  Name  Route  Sig  Dispense  Refill  . allopurinol (ZYLOPRIM) 300 MG tablet   Oral   Take 300 mg by mouth every evening.         . cholecalciferol (VITAMIN D) 1000 units tablet   Oral   Take 2,000 Units by mouth every evening.         Marland Kitchen lisinopril (PRINIVIL,ZESTRIL) 10 MG tablet   Oral   Take 10 mg by mouth daily.         Marland Kitchen lisinopril-hydrochlorothiazide (PRINZIDE,ZESTORETIC) 20-12.5 MG tablet   Oral   Take 1 tablet by mouth daily.         . pantoprazole (PROTONIX) 40 MG tablet   Oral   Take 40 mg by mouth every evening.         . ciprofloxacin (CIPRO) 500 MG tablet   Oral   Take 500 mg by mouth 2 (two) times daily.         Marland Kitchen docusate (COLACE) 50 MG/5ML liquid   Oral   Take 20 mLs (200 mg total) by mouth daily.   100 mL  0   . nystatin (NYSTATIN) powder   Topical   Apply topically 3 (three) times daily.   15 g   0   . polyethylene glycol (MIRALAX / GLYCOLAX) packet   Oral   Take 17 g by mouth daily.   14 each   0     Allergies Bee venom  Family History  Problem Relation Age of Onset  . Breast cancer Mother   . Bone cancer Father     Social History Social History  Substance Use Topics  . Smoking status: Never Smoker   . Smokeless tobacco: Never Used  . Alcohol Use: No    Review of Systems, Limited by patient mental status this is all the extent that I can get from him Constitutional: No fever/chills Eyes: No visual changes. ENT: No sore throat. No stiff neck no neck pain Cardiovascular: Denies chest pain. Respiratory: Positive shortness of breath. Gastrointestinal:   no vomiting.  No diarrhea.  No constipation. Genitourinary:  Negative for dysuria. Musculoskeletal: Positive lower extremity swelling Skin: Negative for rash. Neurological: Negative for headaches, focal weakness or numbness. 10-point ROS otherwise negative.  ____________________________________________   PHYSICAL EXAM:  VITAL SIGNS: ED Triage Vitals  Enc Vitals Group     BP 05/07/16 1428 160/79 mmHg     Pulse Rate 05/07/16 1428 77     Resp 05/07/16 1428 34     Temp 05/07/16 1428 98 F (36.7 C)     Temp Source 05/07/16 1428 Oral     SpO2 05/07/16 1425 88 %     Weight 05/07/16 1428 346 lb (156.945 kg)     Height 05/07/16 1428 5' 9"  (1.753 m)     Head Cir --      Peak Flow --      Pain Score 05/07/16 1431 3     Pain Loc --      Pain Edu? --      Excl. in [place]? --     Constitutional: Somnolent but arouses to verbal stimuli. Tolerating BiPAP well Eyes: Conjunctivae are normal. PERRL. EOMI. Head: Atraumatic. Nose: No congestion/rhinnorhea. Mouth/Throat: Mucous membranes are moist.  Oropharynx non-erythematous. Neck: No stridor.   Nontender with no meningismus Cardiovascular: Normal rate, regular rhythm. Grossly normal heart sounds.  Good peripheral circulation. Respiratory: Normal respiratory effort.  No retractions. Significantly diminished in the bases but no rales or rhonchi and this could be secondary to poor respiratory effort although the BiPAP is helping Abdominal: Soft and nontender. No distention. No guarding no rebound or obesity Musculoskeletal: No lower extremity tenderness. No joint effusions, no DVT signs strong distal pulses 4+ pitting symmetric edema Neurologic:  Normal speech and language. No gross focal neurologic deficits are appreciated.  Skin:  Skin is warm, dry and intact. No rash noted. Psychiatric: Mood and affect are normal. Speech and behavior are normal.  ____________________________________________   LABS (all labs ordered are listed, but only abnormal results are displayed)  Labs Reviewed  CBC - Abnormal;  Notable for the following:    RBC 4.08 (*)    Hemoglobin 11.2 (*)    HCT 33.9 (*)    RDW 19.5 (*)    Platelets 131 (*)    All other components within normal limits  COMPREHENSIVE METABOLIC PANEL - Abnormal; Notable for the following:    Chloride 99 (*)    CO2 40 (*)    BUN 22 (*)    Calcium 8.6 (*)    Albumin 3.4 (*)    ALT 11 (*)  Anion gap 4 (*)    All other components within normal limits  BRAIN NATRIURETIC PEPTIDE - Abnormal; Notable for the following:    B Natriuretic Peptide 212.0 (*)    All other components within normal limits  BLOOD GAS, ARTERIAL - Abnormal; Notable for the following:    pCO2 arterial 76 (*)    pO2, Arterial 119 (*)    Bicarbonate 42.9 (*)    Acid-Base Excess 14.4 (*)    All other components within normal limits  CULTURE, BLOOD (ROUTINE X 2)  CULTURE, BLOOD (ROUTINE X 2)  TROPONIN I  URINALYSIS COMPLETEWITH MICROSCOPIC ([facility] ONLY)   ____________________________________________  EKG  I personally interpreted any EKGs ordered by me or triage Sinus rhythm at 71 bpm no acute ST elevation or depression normal axis, nonspecific ST changes noted. ____________________________________________  [id number]  I reviewed any imaging ordered by me or triage that were performed during my shift and, if possible, patient and/or family made aware of any abnormal findings. ____________________________________________   PROCEDURES  Procedure(s) performed: None  Critical Care performed: CRITICAL CARE Performed by: Schuyler Amor   Total critical care time: 40 minutes  Critical care time was exclusive of separately billable procedures and treating other patients.  Critical care was necessary to treat or prevent imminent or life-threatening deterioration.  Critical care was time spent personally by me on the following activities: development of treatment plan with patient and/or surrogate as well as nursing, discussions with consultants, evaluation of  patient's response to treatment, examination of patient, obtaining history from patient or surrogate, ordering and performing treatments and interventions, ordering and review of laboratory studies, ordering and review of radiographic studies, pulse oximetry and re-evaluation of patient's condition.   ____________________________________________   INITIAL IMPRESSION / ASSESSMENT AND PLAN / ED COURSE  Pertinent labs & imaging results that were available during my care of the patient were reviewed by me and considered in my medical decision making (see chart for details).  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 Chest x-ray does not show any definitive infiltrate, patient's BNP is somewhat elevated this last echo was reassuring. Patient does have significant bilateral lower extremity edema however. We will treat him with the Lasix, we'll obtain blood cultures, we will give him empiric advice given his recent pneumonia and his respiratory status this time although again no white count and no fever at this time, patient does have a history very recent history of pneumonia sometimes show up later in the clinical course. We'll err on the side of safety. Patient is tolerating the BiPAP well. I discussed with the hospitalist service and they will admit him.  FINAL CLINICAL IMPRESSION(S) / ED DIAGNOSES  Final diagnoses:  None      This chart was dictated using voice recognition software.  Despite best efforts to proofread,  errors can occur which can change meaning.     Schuyler Amor, MD 05/07/16 1617  Schuyler Amor, MD 05/07/16 [phone]

## 2016-05-07 NOTE — H&P (Signed)
PULMONARY / CRITICAL CARE MEDICINE   Name: Dylan Kennedy MRN: [id number] DOB: 1950/12/10    ADMISSION DATE:  05/07/2016 CONSULTATION DATE:  05/07/16 REFERRING MD:  EDP  CHIEF COMPLAINT:  Shortness of breath  HISTORY OF PRESENT ILLNESS:   Dylan Kennedy is a 65 year old male with past medical history significant for hypertension, CHF, pneumonia, gout, GERD, morbid obesity, chronic kidney disease, COPD, hyperlipidemia. Patient presents to [facility] on 6/27 with shortness of breath on exertion. He also states that he has productive cough, which is yellow in color. Patient was diagnosed with UTI on 5/26 and was on antibiotics. Patient has been noncompliant with his medications. Patient states that he  Is on home oxygen. When patient presented to the ED he  was placed on BiPAP. Patient's sats improved with FiO2 of 50%. Patient denies any fever, chills, chest pain, nausea and vomiting. His labs are sodium-143, potassium-4.7, creatinine-1.18, BUN-22, BNP-212, WBC-6.3, platelets-131. CC M team was consulted to admit the patient for further management.  PAST MEDICAL HISTORY :  He  has a past medical history of Hypertension; Gout; GERD (gastroesophageal reflux disease); Morbid obesity (HCC); CKD (chronic kidney disease); Acute hypercapnic respiratory failure (HCC) (02/2016); Pneumonia (02/2016); Hyperlipidemia; and Exogenous obesity.  PAST SURGICAL HISTORY: He  has past surgical history that includes Tonsillectomy; Shoulder surgery; and Colonoscopy.  Allergies  Allergen Reactions  . Bee Venom Hives and Itching    No current facility-administered medications on file prior to encounter.   Current Outpatient Prescriptions on File Prior to Encounter  Medication Sig  . allopurinol (ZYLOPRIM) 300 MG tablet Take 300 mg by mouth every evening.  . cholecalciferol (VITAMIN D) 1000 units tablet Take 2,000 Units by mouth every evening.  Marland Kitchen lisinopril-hydrochlorothiazide  (PRINZIDE,ZESTORETIC) 20-12.5 MG tablet Take 1 tablet by mouth daily.  . pantoprazole (PROTONIX) 40 MG tablet Take 40 mg by mouth every evening.  . docusate (COLACE) 50 MG/5ML liquid Take 20 mLs (200 mg total) by mouth daily.  Marland Kitchen nystatin (NYSTATIN) powder Apply topically 3 (three) times daily.  . polyethylene glycol (MIRALAX / GLYCOLAX) packet Take 17 g by mouth daily.    FAMILY HISTORY:  His has no family status information on file.   SOCIAL HISTORY: He  reports that he has never smoked. He has never used smokeless tobacco. He reports that he does not drink alcohol or use illicit drugs.  REVIEW OF SYSTEMS:   Review of Systems  Constitutional: Positive for malaise/fatigue. Negative for fever, chills and weight loss.  HENT: Negative for congestion.   Respiratory: Positive for cough, sputum production and shortness of breath. Negative for hemoptysis.   Cardiovascular: Negative for chest pain and palpitations.  Gastrointestinal: Negative for nausea, vomiting, diarrhea and constipation.  Genitourinary: Negative for frequency and hematuria.  Musculoskeletal: Negative for back pain and joint pain.  Neurological: Negative for tingling, tremors, sensory change and speech change.  Psychiatric/Behavioral: Negative for hallucinations and substance abuse. The patient is not nervous/anxious.      SUBJECTIVE:  "Patient states that he was feeling short of breath,currently feeling better"  VITAL SIGNS: BP 147/60 mmHg  Pulse 72  Temp(Src) 98 F (36.7 C) (Oral)  Resp 18  Ht  (1.753 m)  Wt 346 lb (156.945 kg)  BMI 51.07 kg/m2  SpO2 95%  HEMODYNAMICS:    VENTILATOR SETTINGS: Vent Mode:  [-]  FiO2 (%):  [38 %] 38 %  INTAKE / OUTPUT:    PHYSICAL EXAMINATION: General:  Morbidly obese white male, found on BiPAP  Neuro: Awake, alert, oriented and follows commands HEENT:  Atraumatic, normocephalic, no discharge, no JVD Cardiovascular:  S1 and S2, regular,no MRG  noted lungs:  Diminished bilaterally, symmetric chest expansion, no wheezes, crackles, rhonchi noted Abdomen:  obese, round, soft, nontender, no guarding noted Musculoskeletal:  3+ pitting edema noted Skin:  grossly intact  LABS:  BMET  Recent Labs Lab 05/07/16 1436  NA 143  K 4.7  CL 99*  CO2 40*  BUN 22*  CREATININE 1.18  GLUCOSE 98    Electrolytes  Recent Labs Lab 05/07/16 1436  CALCIUM 8.6*    CBC  Recent Labs Lab 05/07/16 1436  WBC 6.3  HGB 11.2*  HCT 33.9*  PLT 131*    Coag's No results for input(s): APTT, INR in the last 168 hours.  Sepsis Markers No results for input(s): LATICACIDVEN, PROCALCITON, O2SATVEN in the last 168 hours.  ABG  Recent Labs Lab 05/07/16 1522  PHART 7.36  PCO2ART 76*  PO2ART 119*    Liver Enzymes  Recent Labs Lab 05/07/16 1436  AST 21  ALT 11*  ALKPHOS 64  BILITOT 1.0  ALBUMIN 3.4*    Cardiac Enzymes  Recent Labs Lab 05/07/16 1436  TROPONINI <0.03    Glucose No results for input(s): GLUCAP in the last 168 hours.  Imaging Dg Chest Portable 1 View  05/07/2016  CLINICAL DATA:  Shortness of breath on exertion with increased peripheral edema EXAM: PORTABLE CHEST 1 VIEW COMPARISON:  03/01/2016 FINDINGS: Cardiac shadow remains enlarged. Some thickening of the minor fissure is noted on the right. Mild left basilar density is again seen. Given the chronicity it is likely related to scarring. No new focal infiltrate is seen. No bony abnormality is noted. IMPRESSION: Persistent left basilar changes likely related to scarring. Some new thickening of the minor fissure on the right. Electronically Signed   By: Alcide CleverMark  Lukens M.D.   On: 05/07/2016 14:58     STUDIES:  4/17 ECHO>Systolic function was vigorous. The estimated ejection fraction was in the range of 65% to 70%. Wall motion was normal; there were no regional wall motion abnormalities.  CULTURES 5/26 urine culture>> positive for Escherichia coli  ANTIBIOTICS: 6/27  Vancomycin >6/27 6/27 Zosyn>6/27/  SIGNIFICANT EVENTS: [id number]/[id number]>>65 year old male admitted to the ICU for COPD exacerbation requiring BiPAP  LINES/TUBES: NONE  DISCUSSION: 65 year old male with history of chronic kidney disease, morbid obesity, CHF presenting to ED with COPD exacerbation rrequiring BiPAP.  ASSESSMENT / PLAN:  PULMONARY A: Acute on chronic respiratory failure COPD exacerbation. Recent Hx of PNA- NO Infilterates Obstructive sleep apnea Obesity hypoventilation syndrome  P:   Continue BiPAP to keep sats greater than 88%, routine ABG Bronchodilators Incentive spirometer/flutter valve.   CARDIOVASCULAR A:  History of CHF History of hypertension  History of hyperlipidemia  elevated BNP   P:  Continuous telemetry  MAP goals>65 Continue lisinopril Follow BNP ECHO> EF 65-70% Received 40mg  lasix in ER  RENAL A:    Acute on Chronic Kidney disease P:   Follow chemistry Replace electrolytes per ICU protocol  GASTROINTESTINAL A:   History of GERD  P:   Heart healthy diet Protonix  HEMATOLOGIC A:    hypoalbuminemia  thrombocytopenia  P:  SCDs Lovenox for DVT prophylaxis   INFECTIOUS A:    B/L Lower extremities cellulitis? P:   Monitor fever curve Follow CBC  ENDOCRINE A:   No active issues P:   Blood sugar checks with BMP  NEUROLOGICAL A:   NO ACTIVE ISSUES P:  RASS goal:0    Bincy Varughese,AG-ACNP Pulmonary and Critical Care Medicine [facility] Pager: [phone]  05/07/2016, 4:43 PM  STAFF NOTE: I, Dr. Stephanie AcreVishal Evyn Putzier have personally reviewed patient's available data, including medical history, events of note, physical examination and test results as part of my evaluation. I have discussed with NP  Varughese  and other care providers such as pharmacist, RN and RRT.  In addition,  I personally evaluated patient and elicited key findings of   HPI:  65 year old male past medical history of morbid obesity,  hypertension, CHF, recent pneumonia, gout, GERD, chronic kidney disease, COPD, hyperlipidemia, seen in consultation for hypercapnic respiratory failure. Patient admitted by the hospitalist services today for shortness of breath with exertion, noted to have productive cough yellow in color of the past 2 days. Had a recent admission [facility] for pneumonia in April, has since follow up with Dr. Mayo AoFlemming. His spirometry is consistent with severe extrathoracic restriction due to body habitus, and he is currently being worked up for obstructive sleep apnea. He is on 3 L of O2 at rest  O:  GEN-obese, on Bipap, moderate respiratory distress HEENT-PERRLA, no lesion CVS-s1, s2, no murmurs LUNGS-shallow BS, dec basilar BS, no wheezes no crackles ABD-obese, nt, +BS MSK-2+ edema, venous stasis  A: 65 year old male past medical history of COPD, pulmonary obesity, seen in consultation for hypercapnic respiratory failure. Patient follows with Dr. Mayo AoFlemming  Acute on chronic hypercapnic/hypoxic respiratory failure Morbid obesity COPD Suspected sleep apnea/obesity hypoventilation syndrome History of CHF Hypertension Hyperlipidemia Dependent lower extremity edema  P:   -Treated as COPD exacerbation with bronchodilators, steroids, schedule DuoNeb's -We'll continue with BiPAP for the tonight, target tidal volumes of 450-500, may take breaks off the BiPAP -Keep sats greater than 88% -Low BNP, and chest x-ray reviewed,and he can findings of CHF exacerbation at this time -Incentive spirometer/flutter valve -ABG in a.m.  Marland Kitchen.  Rest per NP/medical resident whose note is outlined above and that I agree with  The patient is critically ill with multiple organ systems failure and requires high complexity decision making for assessment and support, frequent evaluation and titration of therapies, application of advanced monitoring technologies and extensive interpretation of multiple databases.   Critical Care Time  devoted to patient care services described in this note is  45 Minutes.   This time reflects time of care of this signee Dr Stephanie AcreVishal Eder Macek.  This critical care time does not reflect procedure time, or teaching time or supervisory time of PA/NP/Med-student/Med Resident etc but could involve care discussion time.  Stephanie AcreVishal Faysal Fenoglio, MD [place] Pulmonary and Critical Care Pager [phone] (please enter 7-digits) On Call Pager - [phone] (please enter 7-digits)  Note: This note was prepared with Dragon dictation along with smaller phrase technology. Any transcriptional errors that result from this process are unintentional.

## 2016-05-08 ENCOUNTER — Inpatient Hospital Stay: Payer: No Typology Code available for payment source

## 2016-05-08 DIAGNOSIS — I5033 Acute on chronic diastolic (congestive) heart failure: Secondary | ICD-10-CM

## 2016-05-08 LAB — BLOOD GAS, ARTERIAL
ACID-BASE EXCESS: 16 mmol/L — AB (ref 0.0–3.0)
ALLENS TEST (PASS/FAIL): POSITIVE — AB
BICARBONATE: 44.4 meq/L — AB (ref 21.0–28.0)
Delivery systems: POSITIVE
Expiratory PAP: 6
FIO2: 0.4
Inspiratory PAP: 18
MECHANICAL RATE: 12
O2 Saturation: 92.6 %
PATIENT TEMPERATURE: 37
pCO2 arterial: 75 mmHg (ref 32.0–48.0)
pH, Arterial: 7.38 (ref 7.350–7.450)
pO2, Arterial: 67 mmHg — ABNORMAL LOW (ref 83.0–108.0)

## 2016-05-08 LAB — URINALYSIS COMPLETE WITH MICROSCOPIC (ARMC ONLY)
BILIRUBIN URINE: NEGATIVE
Bacteria, UA: NONE SEEN
Glucose, UA: NEGATIVE mg/dL
Hgb urine dipstick: NEGATIVE
KETONES UR: NEGATIVE mg/dL
Leukocytes, UA: NEGATIVE
NITRITE: NEGATIVE
PH: 7 (ref 5.0–8.0)
Protein, ur: NEGATIVE mg/dL
SPECIFIC GRAVITY, URINE: 1.013 (ref 1.005–1.030)

## 2016-05-08 LAB — CBC
HEMATOCRIT: 38.8 % — AB (ref 40.0–52.0)
HEMOGLOBIN: 12.4 g/dL — AB (ref 13.0–18.0)
MCH: 26.8 pg (ref 26.0–34.0)
MCHC: 31.9 g/dL — AB (ref 32.0–36.0)
MCV: 84 fL (ref 80.0–100.0)
Platelets: 130 10*3/uL — ABNORMAL LOW (ref 150–440)
RBC: 4.62 MIL/uL (ref 4.40–5.90)
RDW: 19.5 % — ABNORMAL HIGH (ref 11.5–14.5)
WBC: 6.2 10*3/uL (ref 3.8–10.6)

## 2016-05-08 LAB — PHOSPHORUS: Phosphorus: 4.1 mg/dL (ref 2.5–4.6)

## 2016-05-08 LAB — BASIC METABOLIC PANEL
Anion gap: 8 (ref 5–15)
Anion gap: 8 (ref 5–15)
BUN: 20 mg/dL (ref 6–20)
BUN: 26 mg/dL — ABNORMAL HIGH (ref 6–20)
CALCIUM: 9.1 mg/dL (ref 8.9–10.3)
CHLORIDE: 94 mmol/L — AB (ref 101–111)
CO2: 38 mmol/L — AB (ref 22–32)
CO2: 37 mmol/L — AB (ref 22–32)
CREATININE: 1.24 mg/dL (ref 0.61–1.24)
CREATININE: 1.13 mg/dL (ref 0.61–1.24)
Calcium: 8.6 mg/dL — ABNORMAL LOW (ref 8.9–10.3)
Chloride: 96 mmol/L — ABNORMAL LOW (ref 101–111)
GFR calc Af Amer: >60 mL/min (ref >60)
GFR calc Af Amer: >60 mL/min (ref >60)
GFR calc non Af Amer: >60 mL/min (ref >60)
GFR calc non Af Amer: >60 mL/min (ref >60)
GLUCOSE: 131 mg/dL — AB (ref 65–99)
Glucose, Bld: 127 mg/dL — ABNORMAL HIGH (ref 65–99)
Potassium: 4.6 mmol/L (ref 3.5–5.1)
Potassium: 4.3 mmol/L (ref 3.5–5.1)
Sodium: 140 mmol/L (ref 135–145)
Sodium: 141 mmol/L (ref 135–145)

## 2016-05-08 LAB — MAGNESIUM: Magnesium: 2.1 mg/dL (ref 1.7–2.4)

## 2016-05-08 MED ORDER — METHYLPREDNISOLONE SODIUM SUCC 125 MG IJ SOLR
60.0000 mg | Freq: Two times a day (BID) | INTRAMUSCULAR | Status: DC
  Administered 2016-05-08 – 2016-05-10 (×5): 60 mg via INTRAVENOUS
  Filled 2016-05-08 (×5): qty 2

## 2016-05-08 MED ORDER — FUROSEMIDE 10 MG/ML IJ SOLN
40.0000 mg | Freq: Two times a day (BID) | INTRAMUSCULAR | Status: DC
  Administered 2016-05-08 – 2016-05-11 (×7): 40 mg via INTRAVENOUS
  Filled 2016-05-08 (×7): qty 4

## 2016-05-08 MED ORDER — ENOXAPARIN SODIUM 40 MG/0.4ML ~~LOC~~ SOLN
40.0000 mg | Freq: Two times a day (BID) | SUBCUTANEOUS | Status: DC
  Administered 2016-05-08 – 2016-05-11 (×6): 40 mg via SUBCUTANEOUS
  Filled 2016-05-08 (×6): qty 0.4

## 2016-05-08 NOTE — Care Management (Signed)
Patient from [facility]. Admitted with acute respiratory failure in the setting of COPD exacerbation.

## 2016-05-08 NOTE — Progress Notes (Signed)
eLink Physician-Brief Progress Note Patient Name: Dylan BottomsJames Kennedy DOB: 04/22/1951 MRN: [id number]   Date of Service  05/08/2016  HPI/Events of Note  Asked to review ABG >> 7.38/75/67/44.4. Normal pH and elevated HCO3- c/w chronic pCO2 retention.   eICU Interventions  Continue present management.      Intervention Category Major Interventions: Respiratory failure - evaluation and management  Sommer,Steven Eugene 05/08/2016, 5:41 AM

## 2016-05-08 NOTE — Progress Notes (Signed)
Pt has remained alert and oriented x4 with no c/o pain. Rhonchi is auscultated bilaterally in the upper lobes. Weaned to [facility] around 0900 from 40% FiO2 Bi-Pap. NSR on the cardiac monitor. Pt able to transfer to recliner with minimal assistance. Pt with orders to transfer to the floor. Will continue to monitor.

## 2016-05-08 NOTE — Progress Notes (Signed)
[facility] Critical Care Medicine Progess Note    ASSESSMENT/PLAN    DISCUSSION: 65 year old male with history of chronic kidney disease, morbid obesity, CHF presenting to ED with COPD exacerbation rrequiring BiPAP.  ASSESSMENT / PLAN:  PULMONARY A: Review blood gas on 6/28:7.38/75/67/44.4,: Consistent with severe, but compensated acute on chronic hypercapnic respiratory failure. -Review of chest x-ray image from 6/28, in comparison with previous chest x-ray shows worsening left lower lobe atelectasis and effusion. -Review of CT chest images from 02/23/16: Consistent with very restricted lungs due to severe mediastinal adiposity, chest wall obesity, cardiomegaly. Acute on chronic respiratory failure COPD exacerbation. Recent Hx of PNA- NO Infilterates Obstructive sleep apnea Obesity hypoventilation syndrome  P:  Continue BiPAP to keep sats greater than 88%, Continue IV steroids. routine ABG Bronchodilators Incentive spirometer/flutter valve.  May benefit from Diamox once more stable.  CARDIOVASCULAR A:  Acute History of CHF History of hypertension  History of hyperlipidemia  BNP 6/27:212 Troponin 6/27: Negative  Echocardiogram 02/26/16, EF 65%, LVH. -Suspect acute diastolic congestive heart failure.  P:  We will continue diuresis. MAP goals>65 Continue lisinopril    RENAL A:  Acute on Chronic Kidney disease P:  Follow chemistry Replace electrolytes per ICU protocol  GASTROINTESTINAL A:  History of GERD  P:  Heart healthy diet Protonix  HEMATOLOGIC A:  hypoalbuminemia thrombocytopenia  P:  SCDs Lovenox for DVT prophylaxis   INFECTIOUS A:  B/L Lower extremities cellulitis? -Doubt pneumonia. P:  Monitor fever curve Follow CBC -Discontinue vancomycin, continue azithromycin.  CULTURES MRSA screen and 05/07/16: Negative Blood culture 6/27: Negative thus far 5/26 urine culture>> positive for Escherichia  coli  ANTIBIOTICS: 6/27 Vancomycin >6/27 6/27 Zosyn>6/27  ENDOCRINE A:  No active issues P:  Blood sugar checks with BMP  NEUROLOGICAL A:  NO ACTIVE ISSUES P:  RASS goal:0      LINES/TUBES: NONE  STUDIES:  4/17 ECHO>Systolic function was vigorous. The estimated ejection fraction was in the range of 65% to 70%. Wall motion was normal; there were no regional wall motion abnormalities.   SIGNIFICANT EVENTS: [id number]/7827>>65 year old male admitted to the ICU for COPD exacerbation requiring BiPAP   ---------------------------------------   ----------------------------------------   Name: Dylan Kennedy MRN: [id number] DOB: 09/01/1951    ADMISSION DATE:  05/07/2016     SUBJECTIVE:   Pt currently on the bipap, can not provide history or review of systems.     VITAL SIGNS: Temp:  [98 F (36.7 C)-98.7 F (37.1 C)] 98 F (36.7 C) (06/28 0800) Pulse Rate:  [57-92] 74 (06/28 0906) Resp:  [13-34] 21 (06/28 0906) BP: (113-160)/(56-113) 123/67 mmHg (06/28 0900) SpO2:  [87 %-100 %] 100 % (06/28 0906) FiO2 (%):  [38 %-50 %] 40 % (06/28 0811) Weight:  [330 lb 11 oz (150 kg)-346 lb (156.945 kg)] 330 lb 11 oz (150 kg) (06/28 0500) HEMODYNAMICS:   VENTILATOR SETTINGS: Vent Mode:  [-]  FiO2 (%):  [38 %-50 %] 40 % INTAKE / OUTPUT:  Intake/Output Summary (Last 24 hours) at 05/08/16 0912 Last data filed at 05/08/16 0900  Gross per 24 hour  Intake    200 ml  Output   1740 ml  Net  -1540 ml    PHYSICAL EXAMINATION: Physical Examination:   VS: BP 123/67 mmHg  Pulse 74  Temp(Src) 98 F (36.7 C) (Axillary)  Resp 21  Ht 5\' 9"  (1.753 m)  Wt 330 lb 11 oz (150 kg)  BMI 48.81 kg/m2  SpO2 100%  General Appearance: appears dyspneic.  Neuro:without focal  findings, mental status sleepy but arousable.  HEENT: PERRLA, EOM intact. Pulmonary: Decreased air entry bilaterally. CardiovascularNormal S1,S2.  No m/r/g.   Abdomen: Benign, Soft, non-tender. Renal:  No  costovertebral tenderness  GU:  Not performed at this time. Endocrine: No evident thyromegaly. Skin:   warm, no rashes, no ecchymosis  Extremities: normal, no cyanosis, clubbing.   LABS:   LABORATORY PANEL:   CBC  Recent Labs Lab 05/08/16 0452  WBC 6.2  HGB 12.4*  HCT 38.8*  PLT 130*    Chemistries   Recent Labs Lab 05/07/16 1436 05/08/16 0452  NA 143 140  K 4.7 4.6  CL 99* 94*  CO2 40* 38*  GLUCOSE 98 127*  BUN 22* 20  CREATININE 1.18 1.24  CALCIUM 8.6* 8.6*  MG  --  2.1  PHOS  --  4.1  AST 21  --   ALT 11*  --   ALKPHOS 64  --   BILITOT 1.0  --     No results for input(s): GLUCAP in the last 168 hours.  Recent Labs Lab 05/07/16 1522 05/08/16 0451  PHART 7.36 7.38  PCO2ART 76* 75*  PO2ART 119* 67*    Recent Labs Lab 05/07/16 1436  AST 21  ALT 11*  ALKPHOS 64  BILITOT 1.0  ALBUMIN 3.4*    Cardiac Enzymes  Recent Labs Lab 05/07/16 1436  TROPONINI <0.03    RADIOLOGY:  Dg Chest Port 1 View  05/08/2016  CLINICAL DATA:  Acute respiratory failure EXAM: PORTABLE CHEST 1 VIEW COMPARISON:  05/07/2016 FINDINGS: Marked cardiomegaly. Vascular and interstitial prominence. Left pleural effusion. Mild perihilar airspace opacities. IMPRESSION: The findings likely represent congestive heart failure. Electronically Signed   By: Ellery Plunkaniel R Mitchell M.D.   On: 05/08/2016 06:25   Dg Chest Portable 1 View  05/07/2016  CLINICAL DATA:  Shortness of breath on exertion with increased peripheral edema EXAM: PORTABLE CHEST 1 VIEW COMPARISON:  03/01/2016 FINDINGS: Cardiac shadow remains enlarged. Some thickening of the minor fissure is noted on the right. Mild left basilar density is again seen. Given the chronicity it is likely related to scarring. No new focal infiltrate is seen. No bony abnormality is noted. IMPRESSION: Persistent left basilar changes likely related to scarring. Some new thickening of the minor fissure on the right. Electronically Signed   By: Alcide CleverMark   Lukens M.D.   On: 05/07/2016 14:58       --[name], MD.  ICU Pager: [phone]  Pulmonary and Critical Care Office Number: [phone]  [name], M.D.  [name], M.D.  [name], M.D  05/08/2016   Critical Care Attestation.  I have personally obtained a history, examined the patient, evaluated laboratory and imaging results, formulated the assessment and plan and placed orders. The Patient requires high complexity decision making for assessment and support, frequent evaluation and titration of therapies, application of advanced monitoring technologies and extensive interpretation of multiple databases. The patient has critical illness that could lead imminently to failure of 1 or more organ systems and requires the highest level of physician preparedness to intervene.  Critical Care Time devoted to patient care services described in this note is 35 minutes and is exclusive of time spent in procedures.

## 2016-05-08 NOTE — NC FL2 (Signed)
[place] MEDICAID FL2 LEVEL OF CARE SCREENING TOOL     IDENTIFICATION  Patient Name: Dylan BottomsJames Saran Birthdate: 11/02/1951 Sex: male Admission Date (Current Location): 05/07/2016  [place]   Facility and Address:  [facility], [address], [place], [place] [address]      Provider Number: [id number]  Attending Physician Name and Address:  Shane CrutchPradeep Ramachandran, MD  Relative Name and Phone Number:       Current Level of Care: Hospital Recommended Level of Care: Skilled Nursing Facility Prior Approval Number:    Date Approved/Denied:   PASRR Number: [id number] [id number]  Discharge Plan: SNF    Current Diagnoses: Patient Active Problem List   Diagnosis Date Noted  . COPD exacerbation (HCC) 05/07/2016  . Morbid obesity with BMI of 40.0-44.9, adult (HCC) 03/02/2016  . Endotracheally intubated   . Acute hypercapnic respiratory failure (HCC) 02/24/2016  . CKD (chronic kidney disease) stage 3, GFR 30-59 ml/min 02/24/2016  . Hypokalemia 02/24/2016  . Essential hypertension 02/24/2016  . Gout 02/24/2016  . Candidal intertrigo 02/24/2016  . Acute respiratory failure with hypoxia (HCC)   . Community acquired pneumonia   . Sepsis (HCC)   . Septic shock (HCC)     Orientation RESPIRATION BLADDER Height & Weight     Self, Time, Situation, Place  Normal Continent Weight: (!) 330 lb 11 oz (150 kg) Height:  5\' 9"  (175.3 cm)  BEHAVIORAL SYMPTOMS/MOOD NEUROLOGICAL BOWEL NUTRITION STATUS      Continent Diet (Heart Healty)  AMBULATORY STATUS COMMUNICATION OF NEEDS Skin   Limited Assist Verbally Normal                       Personal Care Assistance Level of Assistance  Bathing, Feeding, Dressing Bathing Assistance: Limited assistance Feeding assistance: Independent Dressing Assistance: Limited assistance     Functional Limitations Info  Sight, Hearing, Speech Sight Info: Adequate Hearing Info: Adequate Speech Info:  Adequate    SPECIAL CARE FACTORS FREQUENCY                       Contractures      Additional Factors Info  Code Status, Allergies Code Status Info: Full Code Allergies Info: Bee Venom           Current Medications (05/08/2016):  This is the current hospital active medication list Current Facility-Administered Medications  Medication Dose Route Frequency Provider Last Rate Last Dose  . 0.9 %  sodium chloride infusion  250 mL Intravenous PRN Bincy S Varughese, NP      . allopurinol (ZYLOPRIM) tablet 300 mg  300 mg Oral Daily Bincy S Varughese, NP   300 mg at 05/08/16 0900  . antiseptic oral rinse (CPC / CETYLPYRIDINIUM CHLORIDE 0.05%) solution 7 mL  7 mL Mouth Rinse q12n4p Shane CrutchPradeep Ramachandran, MD   7 mL at 05/08/16 1200  . azithromycin (ZITHROMAX) tablet 250 mg  250 mg Oral Daily Bincy S Varughese, NP   250 mg at 05/08/16 0900  . budesonide (PULMICORT) nebulizer solution 0.5 mg  0.5 mg Nebulization BID Bincy S Varughese, NP   0.5 mg at 05/08/16 0811  . chlorhexidine (PERIDEX) 0.12 % solution 15 mL  15 mL Mouth Rinse BID Shane CrutchPradeep Ramachandran, MD   15 mL at 05/08/16 0900  . enoxaparin (LOVENOX) injection 40 mg  40 mg Subcutaneous Q12H Shane CrutchPradeep Ramachandran, MD      . furosemide (LASIX) injection 40 mg  40 mg Intravenous BID  Shane CrutchPradeep Ramachandran, MD   40 mg at 05/08/16 1235  . ipratropium-albuterol (DUONEB) 0.5-2.5 (3) MG/3ML nebulizer solution 3 mL  3 mL Nebulization Q4H Bincy S Varughese, NP   3 mL at 05/08/16 1554  . lisinopril (PRINIVIL,ZESTRIL) tablet 10 mg  10 mg Oral Daily Bincy S Varughese, NP   10 mg at 05/08/16 0900  . methylPREDNISolone sodium succinate (SOLU-MEDROL) 125 mg/2 mL injection 60 mg  60 mg Intravenous Q12H Shane CrutchPradeep Ramachandran, MD   60 mg at 05/08/16 1235     Discharge Medications: Please see discharge summary for a list of discharge medications.  Relevant Imaging Results:  Relevant Lab Results:   Additional Information SSN:  [id number]  Dede QuerySarah  McNulty, LCSW

## 2016-05-08 NOTE — Progress Notes (Signed)
Patient ID: Dylan BottomsJames Baranski, male   DOB: 12/20/1950, 65 y.o.   MRN: [id number] Spoke with Dr ram. Pt is on BIPAP and will remain in ICU PCCM will call hospitalist once pt ready for transfer.  Per Old records pt has no h/o COPD

## 2016-05-09 ENCOUNTER — Inpatient Hospital Stay: Payer: No Typology Code available for payment source

## 2016-05-09 DIAGNOSIS — J449 Chronic obstructive pulmonary disease, unspecified: Secondary | ICD-10-CM

## 2016-05-09 DIAGNOSIS — G4733 Obstructive sleep apnea (adult) (pediatric): Secondary | ICD-10-CM

## 2016-05-09 DIAGNOSIS — E662 Morbid (severe) obesity with alveolar hypoventilation: Secondary | ICD-10-CM

## 2016-05-09 LAB — CBC
HEMATOCRIT: 37.7 % — AB (ref 40.0–52.0)
HEMOGLOBIN: 12.1 g/dL — AB (ref 13.0–18.0)
MCH: 26.7 pg (ref 26.0–34.0)
MCHC: 32.2 g/dL (ref 32.0–36.0)
MCV: 83.1 fL (ref 80.0–100.0)
Platelets: 180 10*3/uL (ref 150–440)
RBC: 4.54 MIL/uL (ref 4.40–5.90)
RDW: 19.8 % — ABNORMAL HIGH (ref 11.5–14.5)
WBC: 11 10*3/uL — ABNORMAL HIGH (ref 3.8–10.6)

## 2016-05-09 LAB — BASIC METABOLIC PANEL
ANION GAP: 5 (ref 5–15)
BUN: 28 mg/dL — ABNORMAL HIGH (ref 6–20)
CHLORIDE: 96 mmol/L — AB (ref 101–111)
CO2: 39 mmol/L — AB (ref 22–32)
Calcium: 8.7 mg/dL — ABNORMAL LOW (ref 8.9–10.3)
Creatinine, Ser: 1.14 mg/dL (ref 0.61–1.24)
GFR calc Af Amer: >60 mL/min (ref >60)
GFR calc non Af Amer: >60 mL/min (ref >60)
GLUCOSE: 135 mg/dL — AB (ref 65–99)
POTASSIUM: 4.1 mmol/L (ref 3.5–5.1)
Sodium: 140 mmol/L (ref 135–145)

## 2016-05-09 MED ORDER — NYSTATIN 100000 UNIT/GM EX POWD
Freq: Two times a day (BID) | CUTANEOUS | Status: DC
  Administered 2016-05-09 – 2016-05-11 (×5): via TOPICAL
  Filled 2016-05-09: qty 15

## 2016-05-09 NOTE — Progress Notes (Signed)
[facility] Progess Note    ASSESSMENT/PLAN    DISCUSSION: 65 year old male with history of chronic kidney disease, morbid obesity, CHF presenting to ED with COPD exacerbation requiring BiPAP. Now with improvement, bipap at night  ASSESSMENT / PLAN:  PULMONARY A: Previous abg consistent with severe, but compensated acute on chronic hypercapnic respiratory failure. -Review of chest x-ray image from 6/29, in comparison with previous chest x-ray shows mildly improving left lower lobe atelectasis and effusion. -Review of CT chest images from 02/23/16: Consistent with very restricted lungs due to severe mediastinal adiposity, chest wall obesity, cardiomegaly. Acute on chronic respiratory failure -Dr. Meredeth IdeFleming office informed of his patient - he will make further outpt recs on OSA/OHS/COPD  COPD exacerbation. Recent Hx of PNA- NO Infiltrates Obstructive sleep apnea Obesity hypoventilation syndrome  P:  Continue BiPAP at night Transition from IV to PO steoids (can start with 40mg  PO and taper over 10 days) Bronchodilators Incentive spirometer/flutter valve.   CARDIOVASCULAR A:  Acute History of CHF History of hypertension  History of hyperlipidemia  BNP 6/27:212 Troponin 6/27: Negative  Echocardiogram 02/26/16, EF 65%, LVH. -Suspect acute diastolic congestive heart failure.  P:  We will continue diuresis. MAP goals>65 Continue lisinopril    RENAL A:  Acute on Chronic Kidney disease P:  Follow chemistry Replace electrolytes per ICU protocol  GASTROINTESTINAL A:  History of GERD  P:  Heart healthy diet Protonix  HEMATOLOGIC A:  hypoalbuminemia thrombocytopenia  P:  SCDs Lovenox for DVT prophylaxis   INFECTIOUS A:  B/L Lower extremities cellulitis? -Doubt pneumonia. P:  Monitor fever curve Follow CBC -Discontinue vancomycin, continue azithromycin.  CULTURES MRSA screen and 05/07/16:  Negative Blood culture 6/27: Negative thus far 5/26 urine culture>> positive for Escherichia coli  ANTIBIOTICS: 6/27 Vancomycin >6/27 6/27 Zosyn>6/27  ENDOCRINE A:  No active issues P:  Blood sugar checks with BMP  NEUROLOGICAL A:  NO ACTIVE ISSUES P:  RASS goal:0  Patient care transferred back to hospitalist.  Dr. Dereck Leepamachandaran has spoken with Dr. Leandra KernVaichhani.  Thank you for consulting [facility], we will signoff at this time.  Please feel free to contact us with any questions at [phone] (please enter 7-digits).    LINES/TUBES: NONE  STUDIES:  4/17 ECHO>Systolic function was vigorous. The estimated ejection fraction was in the range of 65% to 70%. Wall motion was normal; there were no regional wall motion abnormalities.   SIGNIFICANT EVENTS: [id number]/8127>>65 year old male admitted to the ICU for COPD exacerbation requiring BiPAP   ---------------------------------------   ----------------------------------------   Name: Peyton BottomsJames Schlink MRN: [id number] DOB: 01/01/1951    ADMISSION DATE:  05/07/2016     SUBJECTIVE:   Pt transferred out the ICU, now stable off bipap, back on 3-4L of [place] at baseline, still with some mild leg swelling    VITAL SIGNS: Temp:  [97.8 F (36.6 C)-98.1 F (36.7 C)] 97.8 F (36.6 C) (06/29 0520) Pulse Rate:  [67-96] 67 (06/29 0520) Resp:  [15-27] 22 (06/29 0520) BP: (113-139)/(47-79) 120/56 mmHg (06/29 0520) SpO2:  [86 %-94 %] 92 % (06/29 0736) FiO2 (%):  [40 %] 40 % (06/29 0520) Weight:  [321 lb 11.2 oz (145.922 kg)] 321 lb 11.2 oz (145.922 kg) (06/29 0520) HEMODYNAMICS:   VENTILATOR SETTINGS: Vent Mode:  [-]  FiO2 (%):  [40 %] 40 % INTAKE / OUTPUT:  Intake/Output Summary (Last 24 hours) at 05/09/16 0939 Last data filed at 05/09/16 0719  Gross per 24 hour  Intake  940 ml  Output   3300 ml  Net  -2360 ml    PHYSICAL EXAMINATION: Physical Examination:   VS: BP 120/56 mmHg  Pulse  67  Temp(Src) 97.8 F (36.6 C) (Oral)  Resp 22  Ht  (1.753 m)  Wt 321 lb 11.2 oz (145.922 kg)  BMI 47.49 kg/m2  SpO2 92%  General Appearance: appears dyspneic.  Neuro:without focal findings, mental status sleepy but arousable.  HEENT: PERRLA, EOM intact. Pulmonary: Decreased air entry bilaterally. CardiovascularNormal S1,S2.  No m/r/g.   Abdomen: Benign, Soft, non-tender. Renal:  No costovertebral tenderness  GU:  Not performed at this time. Endocrine: No evident thyromegaly. Skin:   warm, no rashes, no ecchymosis  Extremities: normal, no cyanosis, clubbing, +1-2 edema in lower extermities.   LABS:   LABORATORY PANEL:   CBC  Recent Labs Lab 05/09/16 0403  WBC 11.0*  HGB 12.1*  HCT 37.7*  PLT 180    Chemistries   Recent Labs Lab 05/07/16 1436 05/08/16 0452  05/09/16 0403  NA 143 140  < > 140  K 4.7 4.6  < > 4.1  CL 99* 94*  < > 96*  CO2 40* 38*  < > 39*  GLUCOSE 98 127*  < > 135*  BUN 22* 20  < > 28*  CREATININE 1.18 1.24  < > 1.14  CALCIUM 8.6* 8.6*  < > 8.7*  MG  --  2.1  --   --   PHOS  --  4.1  --   --   AST 21  --   --   --   ALT 11*  --   --   --   ALKPHOS 64  --   --   --   BILITOT 1.0  --   --   --   < > = values in this interval not displayed.  No results for input(s): GLUCAP in the last 168 hours.  Recent Labs Lab 05/07/16 1522 05/08/16 0451  PHART 7.36 7.38  PCO2ART 76* 75*  PO2ART 119* 67*    Recent Labs Lab 05/07/16 1436  AST 21  ALT 11*  ALKPHOS 64  BILITOT 1.0  ALBUMIN 3.4*    Cardiac Enzymes  Recent Labs Lab 05/07/16 1436  TROPONINI <0.03    RADIOLOGY:  Dg Chest 1 View  05/09/2016  CLINICAL DATA:  Shortness of breath. EXAM: CHEST 1 VIEW COMPARISON:  05/08/2016.  05/07/2016 . FINDINGS: Persistent cardiomegaly. Interim improvement of bilateral interstitial prominence. Small left pleural effusion again noted. Findings suggest slight improvement congestive heart failure. Left upper lobe subsegmental atelectasis  and or infiltrate noted. No pneumothorax . IMPRESSION: 1. Interim partial clearing of congestive heart failure. Persistent small left pleural effusion. 2. Left upper lobe subsegmental atelectasis and or infiltrate. Follow-up chest x-rays to demonstrate clearing suggested . Electronically Signed   By: Maisie Fus  Register   On: 05/09/2016 07:41   Dg Chest Port 1 View  05/08/2016  CLINICAL DATA:  Acute respiratory failure EXAM: PORTABLE CHEST 1 VIEW COMPARISON:  05/07/2016 FINDINGS: Marked cardiomegaly. Vascular and interstitial prominence. Left pleural effusion. Mild perihilar airspace opacities. IMPRESSION: The findings likely represent congestive heart failure. Electronically Signed   By: Ellery Plunk M.D.   On: 05/08/2016 06:25   Dg Chest Portable 1 View  05/07/2016  CLINICAL DATA:  Shortness of breath on exertion with increased peripheral edema EXAM: PORTABLE CHEST 1 VIEW COMPARISON:  03/01/2016 FINDINGS: Cardiac shadow remains enlarged. Some thickening of the minor fissure is noted  on the right. Mild left basilar density is again seen. Given the chronicity it is likely related to scarring. No new focal infiltrate is seen. No bony abnormality is noted. IMPRESSION: Persistent left basilar changes likely related to scarring. Some new thickening of the minor fissure on the right. Electronically Signed   By: Mark  Lukens M.D.   On: 05/07/2016 14:Alcide [id number]      I have personally obtained a history, examined the patient, evaluated laboratory and imaging results, formulated the assessment and plan and placed orders.  Pulmonary Care Time devoted to patient care services described in this note is 35 minutes.    Stephanie AcreVishal Kimori Tartaglia, MD [facility] Pager [phone]- [phone] (please enter 7-digits) On Call Pager - [phone] (please enter 7-digits)      05/09/2016

## 2016-05-09 NOTE — Progress Notes (Signed)
Pt is alert, confusion at times, up with stand by assist, denies pain, continuing on 4 L oxygen, good appetite, on IV lasix bid, good urine output, using urinal, denies shortness of breath.

## 2016-05-09 NOTE — Clinical Social Work Note (Signed)
Clinical Social Work Assessment  Patient Details  Name: Dylan Kennedy MRN: [id number] Date of Birth: 1950-11-23  Date of referral:  05/09/16               Reason for consult:  Facility Placement                Permission sought to share information with:  Family Supports Permission granted to share information::  Yes, Verbal Permission Granted  Name::     McArthur,Carol  Relationship::  neice  Contact Information:  [phone]  Housing/Transportation Living arrangements for the past 2 months:  [place] of Information:  Patient Patient Interpreter Needed:  None Criminal Activity/Legal Involvement Pertinent to Current Situation/Hospitalization:  No - Comment as needed Significant Relationships:  Siblings, Other Family Members Lives with:  Facility Resident Do you feel safe going back to the place where you live?  Yes Need for family participation in patient care:  No (Coment)  Care giving concerns:  No care giving concerns identified.   Social Worker assessment / plan:  CSW met with pt to address consult. CSW introduced herself and explained role of social work. CSW also explained the process of discharging to SNF. Pt was admitted from [facility]. Pt would like to return when when at discharge. CSW will follow up with facility. CSW will continue to follow.   Employment status:  Disabled (Comment on whether or not currently receiving Disability) Insurance information:  Managed Medicare PT Recommendations:  Not assessed at this time Information / Referral to community resources:  Other (Comment Required) ([place])  Patient/Family's Response to care:  Pt was appreciative of CSW support.   Patient/Family's Understanding of and Emotional Response to Diagnosis, Current Treatment, and Prognosis:  Pt would like to return to facility.   Emotional Assessment Appearance:  Appears stated age Attitude/Demeanor/Rapport:  Other (Appropriate) Affect (typically  observed):  Accepting, Pleasant Orientation:  Oriented to Self, Oriented to Place, Oriented to  Time, Oriented to Situation Alcohol / Substance use:  Never Used Psych involvement (Current and /or in the community):  No (Comment)  Discharge Needs  Concerns to be addressed:  Adjustment to Illness Readmission within the last 30 days:  No Current discharge risk:  Chronically ill Barriers to Discharge:  Continued Medical Work up   [employer], LCSW 05/09/2016, 5:14 PM

## 2016-05-10 MED ORDER — TIOTROPIUM BROMIDE MONOHYDRATE 18 MCG IN CAPS
18.0000 ug | ORAL_CAPSULE | Freq: Every morning | RESPIRATORY_TRACT | Status: AC
Start: 2016-05-10 — End: ?

## 2016-05-10 MED ORDER — PREDNISONE 10 MG (21) PO TBPK: ORAL_TABLET | ORAL | Status: DC

## 2016-05-10 MED ORDER — FLUTICASONE-SALMETEROL 250-50 MCG/DOSE IN AEPB
1.0000 | INHALATION_SPRAY | Freq: Two times a day (BID) | RESPIRATORY_TRACT | Status: AC
Start: 2016-05-10 — End: ?

## 2016-05-10 MED ORDER — PREDNISONE 20 MG PO TABS
50.0000 mg | ORAL_TABLET | Freq: Every day | ORAL | Status: DC
  Administered 2016-05-10 – 2016-05-11 (×2): 50 mg via ORAL
  Filled 2016-05-10 (×2): qty 2

## 2016-05-10 MED ORDER — AZITHROMYCIN 250 MG PO TABS: 250.0000 mg | ORAL_TABLET | Freq: Every day | ORAL | Status: DC

## 2016-05-10 MED ORDER — IPRATROPIUM-ALBUTEROL 0.5-2.5 (3) MG/3ML IN SOLN: 3.0000 mL | Freq: Four times a day (QID) | RESPIRATORY_TRACT | Status: AC | PRN

## 2016-05-10 NOTE — Progress Notes (Signed)
[facility] [place] [facility]   PATIENT NAME: Dylan Kennedy    MR#:  [id number]  DATE OF BIRTH:  05/17/1951  SUBJECTIVE:  CHIEF COMPLAINT:   Chief Complaint  Patient presents with  . Shortness of Breath  . Leg Swelling     Due to SOB required Bipap initially, but feels better now, On bipap at night, and baseline oxygen use.  REVIEW OF SYSTEMS:  CONSTITUTIONAL: No fever, fatigue or weakness.  EYES: No blurred or double vision.  EARS, NOSE, AND THROAT: No tinnitus or ear pain.  RESPIRATORY: No cough, shortness of breath, wheezing or hemoptysis.  CARDIOVASCULAR: No chest pain, orthopnea, edema.  GASTROINTESTINAL: No nausea, vomiting, diarrhea or abdominal pain.  GENITOURINARY: No dysuria, hematuria.  ENDOCRINE: No polyuria, nocturia,  HEMATOLOGY: No anemia, easy bruising or bleeding SKIN: No rash or lesion. MUSCULOSKELETAL: No joint pain or arthritis.   NEUROLOGIC: No tingling, numbness, weakness.  PSYCHIATRY: No anxiety or depression.   ROS  DRUG ALLERGIES:   Allergies  Allergen Reactions  . Bee Venom Hives and Itching    VITALS:  Blood pressure 127/47, pulse 77, temperature 98.3 F (36.8 C), temperature source Oral, resp. rate 20, height 5\' 9"  (1.753 m), weight 143.291 kg (315 lb 14.4 oz), SpO2 92 %.  PHYSICAL EXAMINATION:  GENERAL:  65 y.o.-year-old obese patient lying in the bed with no acute distress.  EYES: Pupils equal, round, reactive to light and accommodation. No scleral icterus. Extraocular muscles intact.  HEENT: Head atraumatic, normocephalic. Oropharynx and nasopharynx clear.  NECK:  Supple, no jugular venous distention. No thyroid enlargement, no tenderness.  LUNGS: Normal breath sounds bilaterally, some wheezing, rales,rhonchi or crepitation. No use of accessory muscles of respiration.  CARDIOVASCULAR: S1, S2 normal. No murmurs, rubs, or gallops.  ABDOMEN: Soft, nontender, nondistended. Bowel sounds present. No organomegaly or  mass.  EXTREMITIES: mild pedal edema,no cyanosis, or clubbing.  NEUROLOGIC: Cranial nerves II through XII are intact. Muscle strength 5/5 in all extremities. Sensation intact. Gait not checked.  PSYCHIATRIC: The patient is alert and oriented x 3.  SKIN: No obvious rash, lesion, or ulcer.   Physical Exam LABORATORY PANEL:   CBC  Recent Labs Lab 05/09/16 0403  WBC 11.0*  HGB 12.1*  HCT 37.7*  PLT 180   ------------------------------------------------------------------------------------------------------------------  Chemistries   Recent Labs Lab 05/07/16 1436 05/08/16 0452  05/09/16 0403  NA 143 140  < > 140  K 4.7 4.6  < > 4.1  CL 99* 94*  < > 96*  CO2 40* 38*  < > 39*  GLUCOSE 98 127*  < > 135*  BUN 22* 20  < > 28*  CREATININE 1.18 1.24  < > 1.14  CALCIUM 8.6* 8.6*  < > 8.7*  MG  --  2.1  --   --   AST 21  --   --   --   ALT 11*  --   --   --   ALKPHOS 64  --   --   --   BILITOT 1.0  --   --   --   < > = values in this interval not displayed. ------------------------------------------------------------------------------------------------------------------  Cardiac Enzymes  Recent Labs Lab 05/07/16 1436  TROPONINI <0.03   ------------------------------------------------------------------------------------------------------------------  RADIOLOGY:  Dg Chest 1 View  05/09/2016  CLINICAL DATA:  Shortness of breath. EXAM: CHEST 1 VIEW COMPARISON:  05/08/2016.  05/07/2016 . FINDINGS: Persistent cardiomegaly. Interim improvement of bilateral interstitial prominence. Small left pleural effusion again noted. Findings  suggest slight improvement congestive heart failure. Left upper lobe subsegmental atelectasis and or infiltrate noted. No pneumothorax . IMPRESSION: 1. Interim partial clearing of congestive heart failure. Persistent small left pleural effusion. 2. Left upper lobe subsegmental atelectasis and or infiltrate. Follow-up chest x-rays to demonstrate clearing  suggested . Electronically Signed   By: Maisie Fushomas  Register   On: 05/09/2016 07:41    ASSESSMENT AND PLAN:   Active Problems:   COPD exacerbation (HCC)  * ac on ch respi failure with copd exacerbation.   Treated by pulmo team, steroids, and nebs, with bipap.   Azithromycin.   Change to oral steroids.   Bipap at night as per Pulm instructions.  * Ac diastolic CHF   IV lasix- good response- total 7 ltr Fluid negative balance.  * Htn   Lisinopril.  * Sleep apnea and obesity hypoventilation syndrome   Bipap at night.   All the records are reviewed and case discussed with Care Management/Social Workerr. Management plans discussed with the patient, family and they are in agreement.  CODE STATUS: full  TOTAL TIME TAKING CARE OF THIS PATIENT: 35 minutes.     POSSIBLE D/C IN 1 DAYS, DEPENDING ON CLINICAL CONDITION.   Altamese DillingVACHHANI, Assyria Morreale M.D on 05/10/2016   Between 7am to 6pm - Pager - [phone]  After 6pm go to [email or web address] - Social research officer, governmentpassword EPAS [facility]  [facility]  Office  [phone]  CC: Primary care physician; No primary care provider on file.  Note: This dictation was prepared with Dragon dictation along with smaller phrase technology. Any transcriptional errors that result from this process are unintentional.

## 2016-05-10 NOTE — Discharge Summary (Addendum)
[facility] [place] at [facility]   PATIENT NAME: Dylan Kennedy    MR#:  [id number]  DATE OF BIRTH:  05/03/1951  DATE OF ADMISSION:  05/07/2016 ADMITTING PHYSICIAN: No admitting provider for patient encounter.  DATE OF DISCHARGE: 05/11/2016  PRIMARY CARE PHYSICIAN: No primary care provider on file.    ADMISSION DIAGNOSIS:  Respiratory distress [R06.00]  DISCHARGE DIAGNOSIS:  Active Problems:   COPD exacerbation (HCC)   Ac diastolic CHF  SECONDARY DIAGNOSIS:   Past Medical History  Diagnosis Date  . Hypertension   . Gout   . GERD (gastroesophageal reflux disease)   . Morbid obesity (HCC)   . CKD (chronic kidney disease)   . Acute hypercapnic respiratory failure (HCC) 02/2016  . Pneumonia 02/2016  . Hyperlipidemia   . Exogenous obesity     HOSPITAL COURSE:   * ac on ch respi failure with copd exacerbation.  Treated by pulmo team, steroids, and nebs, with bipap.  Azithromycin.  Change to oral steroids.  Bipap at night as per Pulm instructions.   Advised to have Pulm appointment in next 2 weeks to have sleep study.  * Ac diastolic CHF  IV lasix- good response- total 7 ltr Fluid negative balance.  * Htn  Lisinopril.  * Sleep apnea and obesity hypoventilation syndrome  Bipap at night given in hospital.    He need to have sleep study with help of his PMD or Pulmonologist.  DISCHARGE CONDITIONS:   Stable.  CONSULTS OBTAINED:     DRUG ALLERGIES:   Allergies  Allergen Reactions  . Bee Venom Hives and Itching    DISCHARGE MEDICATIONS:   Current Discharge Medication List    START taking these medications   Details  azithromycin (ZITHROMAX) 250 MG tablet Take 1 tablet (250 mg total) by mouth daily. Qty: 3 each, Refills: 0    Fluticasone-Salmeterol (ADVAIR DISKUS) 250-50 MCG/DOSE AEPB Inhale 1 puff into the lungs 2 (two) times daily. Qty: 60 each, Refills: 1    ipratropium-albuterol (DUONEB) 0.5-2.5 (3) MG/3ML SOLN  Take 3 mLs by nebulization every 6 (six) hours as needed. Qty: 360 mL, Refills: 0    predniSONE (STERAPRED UNI-PAK 21 TAB) 10 MG (21) TBPK tablet Take 6 tabs first day, 5 tab on day 2, then 4 on day 3rd, 3 tabs on day 4th , 2 tab on day 5th, and 1 tab on 6th day. Qty: 21 tablet, Refills: 0    tiotropium (SPIRIVA HANDIHALER) 18 MCG inhalation capsule Place 1 capsule (18 mcg total) into inhaler and inhale every morning. Qty: 30 capsule, Refills: 0      CONTINUE these medications which have NOT CHANGED   Details  allopurinol (ZYLOPRIM) 300 MG tablet Take 300 mg by mouth daily.     cholecalciferol (VITAMIN D) 1000 units tablet Take 2,000 Units by mouth daily.     lisinopril (PRINIVIL,ZESTRIL) 10 MG tablet Take 10 mg by mouth daily.    pantoprazole (PROTONIX) 40 MG tablet Take 40 mg by mouth daily.       STOP taking these medications     lisinopril-hydrochlorothiazide (PRINZIDE,ZESTORETIC) 20-12.5 MG tablet          DISCHARGE INSTRUCTIONS:    Need a sleep study. Follow with Dr. Reita ClicheFleming's office in 2 weeks.  If you experience worsening of your admission symptoms, develop shortness of breath, life threatening emergency, suicidal or homicidal thoughts you must seek medical attention immediately by calling 911 or calling your MD immediately  if symptoms less  severe.  You Must read complete instructions/literature along with all the possible adverse reactions/side effects for all the Medicines you take and that have been prescribed to you. Take any new Medicines after you have completely understood and accept all the possible adverse reactions/side effects.   Please note  You were cared for by a hospitalist during your hospital stay. If you have any questions about your discharge medications or the care you received while you were in the hospital after you are discharged, you can call the unit and asked to speak with the hospitalist on call if the hospitalist that took care of you is  not available. Once you are discharged, your primary care physician will handle any further medical issues. Please note that NO REFILLS for any discharge medications will be authorized once you are discharged, as it is imperative that you return to your primary care physician (or establish a relationship with a primary care physician if you do not have one) for your aftercare needs so that they can reassess your need for medications and monitor your lab values.    Today   CHIEF COMPLAINT:   Chief Complaint  Patient presents with  . Shortness of Breath  . Leg Swelling    HISTORY OF PRESENT ILLNESS:  Dylan Kennedy  is a 65 y.o. male with a known history of hypertension, CHF, pneumonia, gout, GERD, morbid obesity, chronic kidney disease, COPD, hyperlipidemia. Patient [facility] on [facility] with shortness of breath on exertion. He also states that he has productive cough, which is yellow in color. Patient was diagnosed with UTI on 5/26 and was on antibiotics. Patient has been noncompliant with his medications. Patient states that he Is on home oxygen. When patient presented to the ED he was placed on BiPAP. Patient's sats improved with FiO2 of 50%. Patient denies any fever, chills, chest pain, nausea and vomiting. His labs are sodium-143, potassium-4.7, creatinine-1.18, BUN-22, BNP-212, WBC-6.3, platelets-131. CC M team was consulted to admit the patient for further management.   VITAL SIGNS:  Blood pressure 117/67, pulse 61, temperature 98.4 F (36.9 C), temperature source Oral, resp. rate 18, height 5\' 9"  (1.753 m), weight 142.974 kg (315 lb 3.2 oz), SpO2 95 %.  I/O:    Intake/Output Summary (Last 24 hours) at 05/11/16 0946 Last data filed at 05/11/16 0715  Gross per 24 hour  Intake    480 ml  Output   2100 ml  Net  -1620 ml    PHYSICAL EXAMINATION:   GENERAL: 65 y.o.-year-old obese patient lying in the bed with no acute distress.  EYES: Pupils  equal, round, reactive to light and accommodation. No scleral icterus. Extraocular muscles intact.  HEENT: Head atraumatic, normocephalic. Oropharynx and nasopharynx clear.  NECK: Supple, no jugular venous distention. No thyroid enlargement, no tenderness.  LUNGS: Normal breath sounds bilaterally, some wheezing, rales,rhonchi or crepitation. No use of accessory muscles of respiration.  CARDIOVASCULAR: S1, S2 normal. No murmurs, rubs, or gallops.  ABDOMEN: Soft, nontender, nondistended. Bowel sounds present. No organomegaly or mass.  EXTREMITIES: mild pedal edema,no cyanosis, or clubbing.  NEUROLOGIC: Cranial nerves II through XII are intact. Muscle strength 5/5 in all extremities. Sensation intact. Gait not checked.  PSYCHIATRIC: The patient is alert and oriented x 3.  SKIN: No obvious rash, lesion, or ulcer.   DATA REVIEW:   CBC  Recent Labs Lab 05/09/16 0403  WBC 11.0*  HGB 12.1*  HCT 37.7*  PLT 180    Chemistries  Recent Labs Lab 05/07/16 1436 05/08/16 0452  05/09/16 0403  NA 143 140  < > 140  K 4.7 4.6  < > 4.1  CL 99* 94*  < > 96*  CO2 40* 38*  < > 39*  GLUCOSE 98 127*  < > 135*  BUN 22* 20  < > 28*  CREATININE 1.18 1.24  < > 1.14  CALCIUM 8.6* 8.6*  < > 8.7*  MG  --  2.1  --   --   AST 21  --   --   --   ALT 11*  --   --   --   ALKPHOS 64  --   --   --   BILITOT 1.0  --   --   --   < > = values in this interval not displayed.  Cardiac Enzymes  Recent Labs Lab 05/07/16 1436  TROPONINI <0.03    Microbiology Results  Results for orders placed or performed during the hospital encounter of 05/07/16  Culture, blood (routine x 2)     Status: None (Preliminary result)   Collection Time: 05/07/16  3:40 PM  Result Value Ref Range Status   Specimen Description BLOOD RIGHT ASSIST CONTROL  Final   Special Requests   Final    BOTTLES DRAWN AEROBIC AND ANAEROBIC  AERO 10CC ANA 15CC   Culture NO GROWTH 4 DAYS  Final   Report Status PENDING  Incomplete   Culture, blood (routine x 2)     Status: None (Preliminary result)   Collection Time: 05/07/16  3:44 PM  Result Value Ref Range Status   Specimen Description BLOOD LEFT ASSIST CONTROL  Final   Special Requests BOTTLES DRAWN AEROBIC AND ANAEROBIC  10CC  Final   Culture NO GROWTH 4 DAYS  Final   Report Status PENDING  Incomplete  MRSA PCR Screening     Status: None   Collection Time: 05/07/16  8:09 PM  Result Value Ref Range Status   MRSA by PCR NEGATIVE NEGATIVE Final    Comment:        The GeneXpert MRSA Assay (FDA approved for NASAL specimens only), is one component of a comprehensive MRSA colonization surveillance program. It is not intended to diagnose MRSA infection nor to guide or monitor treatment for MRSA infections.     RADIOLOGY:  No results found.  EKG:   Orders placed or performed during the hospital encounter of 05/07/16  . EKG 12-Lead  . EKG 12-Lead  . ED EKG  . ED EKG      Management plans discussed with the patient, family and they are in agreement.  CODE STATUS:     Code Status Orders        Start     Ordered   05/07/16 1650  Full code   Continuous     05/07/16 1651    Code Status History    Date Active Date Inactive Code Status Order ID Comments User Context   02/24/2016  1:54 AM 03/01/2016  8:49 PM Full Code [id number]  Yolanda Manges, DO Inpatient      TOTAL TIME TAKING CARE OF THIS PATIENT: 35 minutes.    Altamese Dilling M.D on 05/11/2016 at 9:46 AM  Between 7am to 6pm - Pager - [phone]  After 6pm go to [email or web address] - Social research officer, government  [facility]  Office  [phone]  CC: Primary care physician; No primary care provider on file.   Note: This dictation was prepared  with Dragon dictation along with smaller phrase technology. Any transcriptional errors that result from this process are unintentional.

## 2016-05-10 NOTE — Clinical Social Work Note (Signed)
CSW spoke with [facility] and pt is able to return over the weekend. Pt is aware and agreeable to discharge plan. MD is also aware that a Discharge Summary in advance for a discharge over the weekend. CSW will send it when it is available. CSW will continue to follow.   Dede QuerySarah Silena Wyss, MSW, LCSW Clinical Social Worker  [phone]

## 2016-05-10 NOTE — Care Management (Signed)
Spoke with Dr. Elisabeth PigeonVachhani and patient will be scheduled follow up as outpatient with Dr. Meredeth IdeFleming for outpatient sleep study/CPAP evaluation. No BiPAP at this time.

## 2016-05-10 NOTE — Care Management (Signed)
Notified by CSW that patient is from [facility]. Patient is on chronic O2 at home. Patient would benefit from BiPAP due to COPD, obstructive sleep apnea, and obesity hypoventilation syndrome. ABG's MUST be done on patient's prescribed O2- THEY CANNOT BE ACCEPTED WHILE ON BIPAP or CPAP. You also need an overnight oximetry on at least 2 liters of O2 for at minimum 2 hours. Patient must have been tried on a CPAP and failed documented.

## 2016-05-11 MED ORDER — IPRATROPIUM-ALBUTEROL 0.5-2.5 (3) MG/3ML IN SOLN: 3.0000 mL | Freq: Four times a day (QID) | RESPIRATORY_TRACT | Status: DC

## 2016-05-11 NOTE — Progress Notes (Signed)
EMS on Unit for transport. Discharged via EMS. Belongings sent with patient and EMS.

## 2016-05-11 NOTE — Progress Notes (Signed)
MD making rounds. Discharge orders received. IV discontinued. Prescriptions given to patient. Discharge paperwork provided, explained, signed and witnessed. No unanswered questions. EMS called for transport to [facility]. Awaiting EMS.

## 2016-05-11 NOTE — Progress Notes (Signed)
Clinical Social Worker was informed that patient will be medically ready to discharge to [facility]. Patient in a agreement with plan. CSW called [facility] to confirm that patient's bed is ready.   Woodroe Modehristina Tyrell Seifer, MSW, LCSW-A, LCAS-A Clinical Social Worker [phone]

## 2016-05-11 NOTE — Progress Notes (Signed)
Seen the pt in room, he is feeling better with good sleep.  Exam- RS- b/l clear air entry, not much wheezing.  Assessment and plan  - COPD exacerbation - Ac on Ch respi failure    As planned earlier- will d/c today to rehab.   Advised to follow with Dr. Meredeth IdeFleming in office, he have appointment on 12 July.

## 2016-05-12 LAB — CULTURE, BLOOD (ROUTINE X 2)
Culture: NO GROWTH
Culture: NO GROWTH

## 2016-07-03 ENCOUNTER — Ambulatory Visit: Payer: No Typology Code available for payment source | Attending: Specialist

## 2016-07-03 DIAGNOSIS — E669 Obesity, unspecified: Secondary | ICD-10-CM | POA: Diagnosis present

## 2016-07-03 DIAGNOSIS — G4733 Obstructive sleep apnea (adult) (pediatric): Secondary | ICD-10-CM | POA: Diagnosis not present

## 2016-07-03 DIAGNOSIS — R0683 Snoring: Secondary | ICD-10-CM | POA: Diagnosis present

## 2017-03-12 IMAGING — CT CT ANGIO CHEST
1 of 2 series · 18 of 30 positions shown · IV contrast (APPLIED)
Comparison: None.

CLINICAL DATA: Shortness of breath for 4 days and left-sided chest
pain, initial encounter

EXAM:
CT ANGIOGRAPHY CHEST WITH CONTRAST
TECHNIQUE: Multidetector CT imaging of the chest was performed using the
standard protocol during bolus administration of intravenous
contrast. Multiplanar CT image reconstructions and MIPs were
obtained to evaluate the vascular anatomy.
CONTRAST:  100 mL Isovue 370.

[Series 5: pe 1.0 thins · axial · 0.86mm/px · z∈[-124,+145]mm · 18 of 303 slices shown]
[im 17/303  lung]
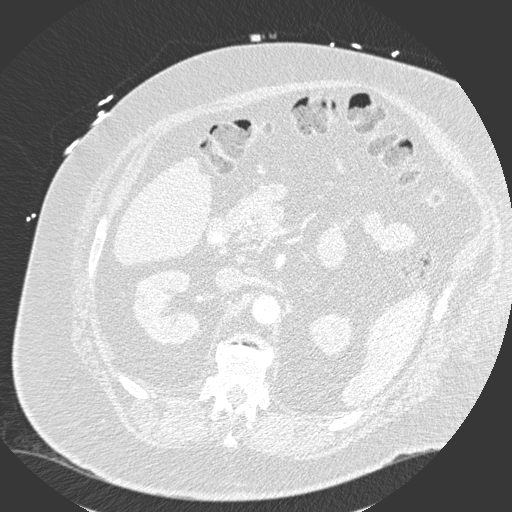
[im 34/303  mediastinal]
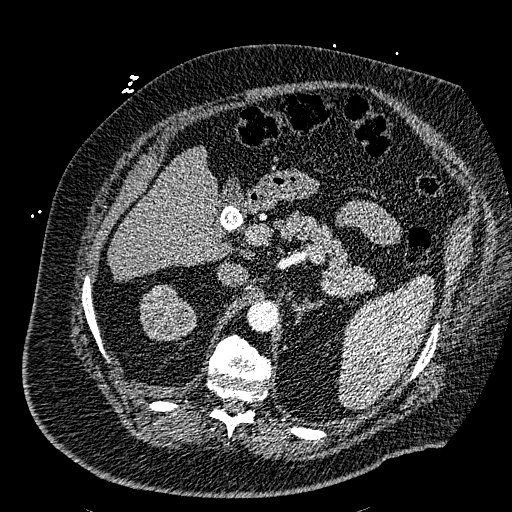
[im 51/303  lung]
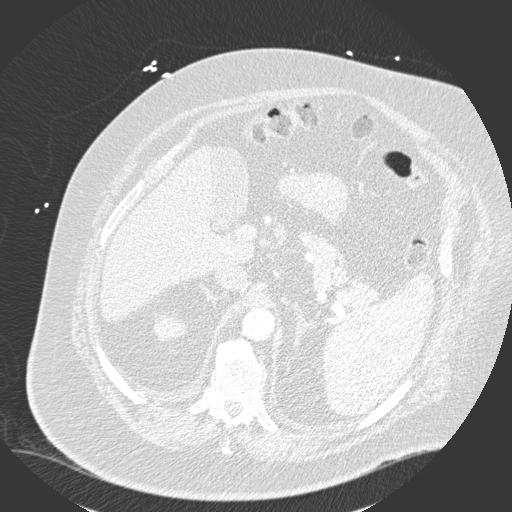
[im 68/303  mediastinal]
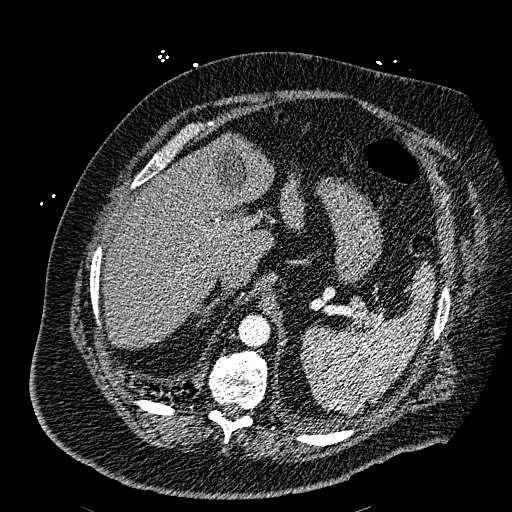
[im 84/303  lung]
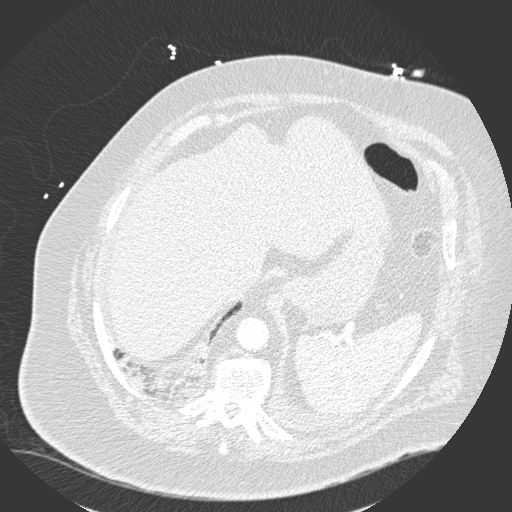
[im 101/303  mediastinal]
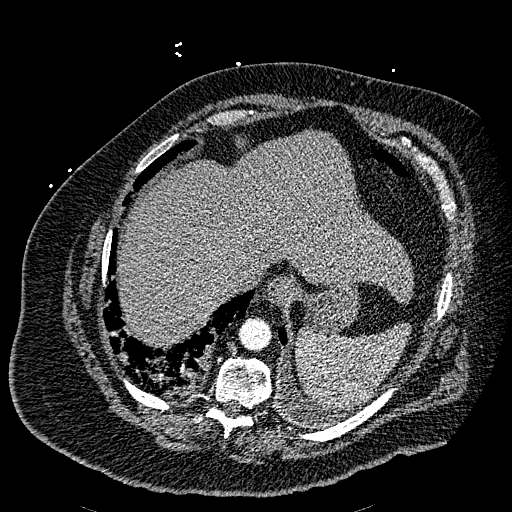
[im 118/303  lung]
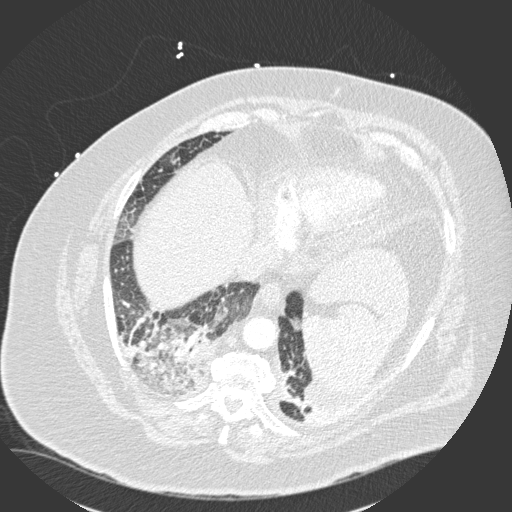
[im 135/303  mediastinal]
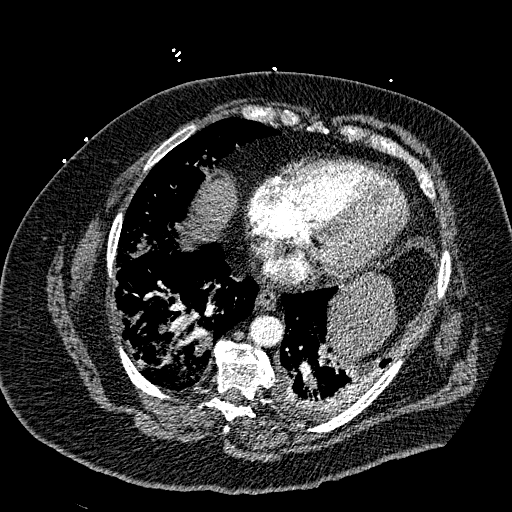
[im 142/303  lung]
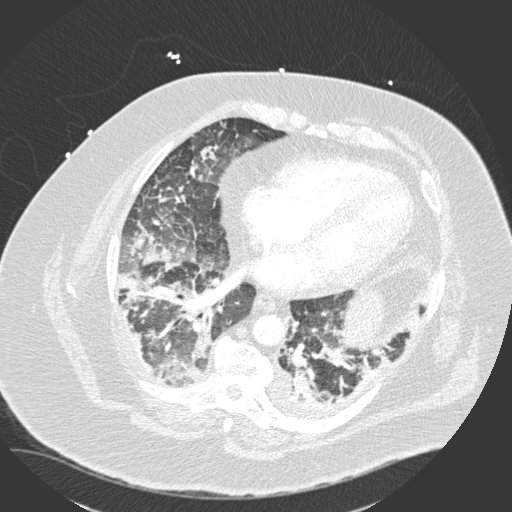
[im 152/303  mediastinal]
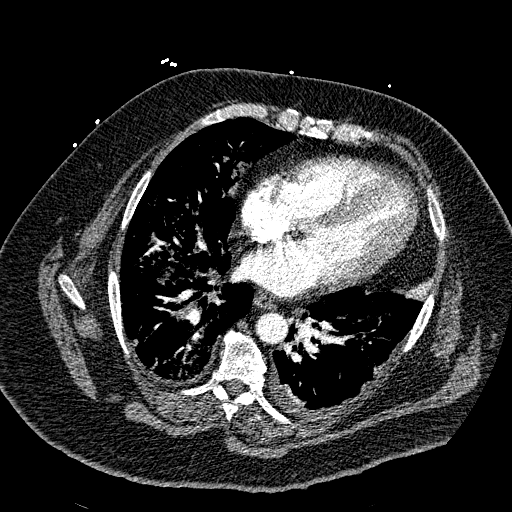
[im 168/303  lung]
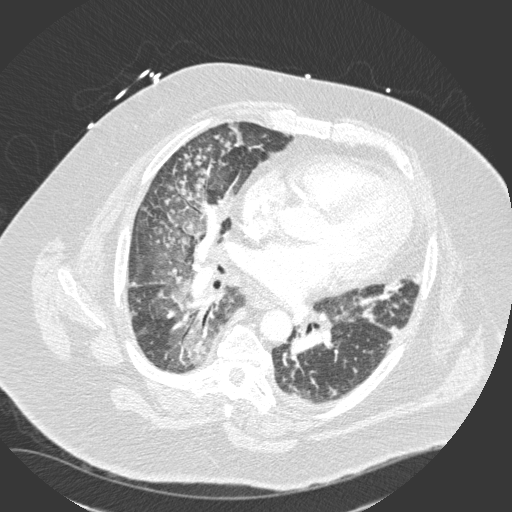
[im 185/303  mediastinal]
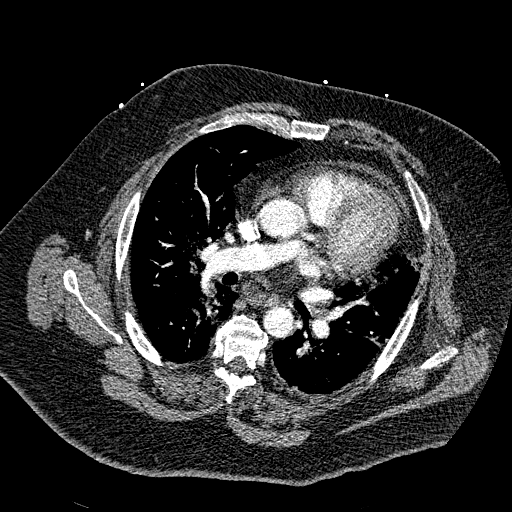
[im 202/303  lung]
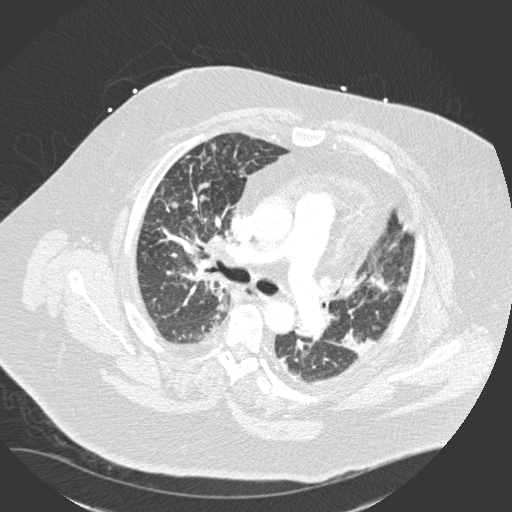
[im 219/303  mediastinal]
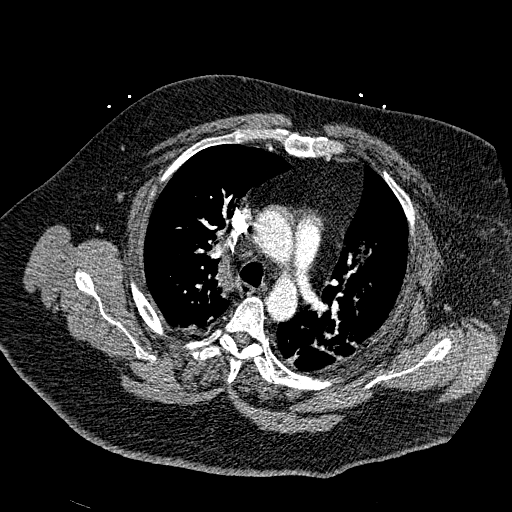
[im 235/303  lung]
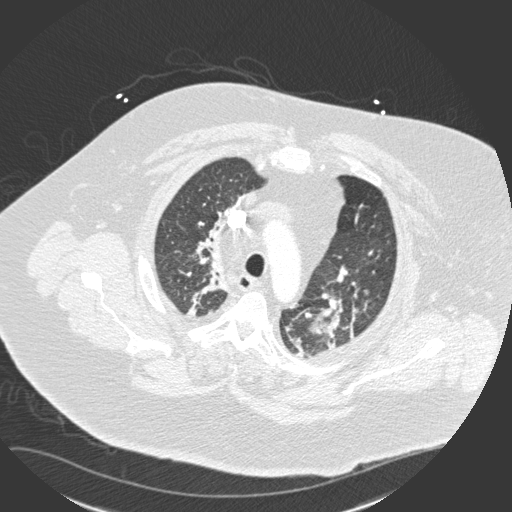
[im 252/303  mediastinal]
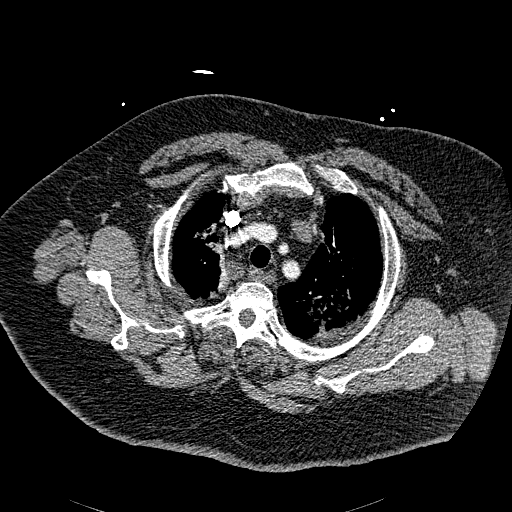
[im 269/303  lung]
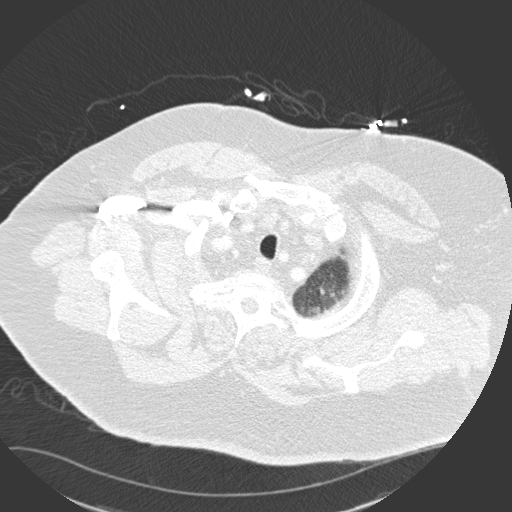
[im 286/303  mediastinal]
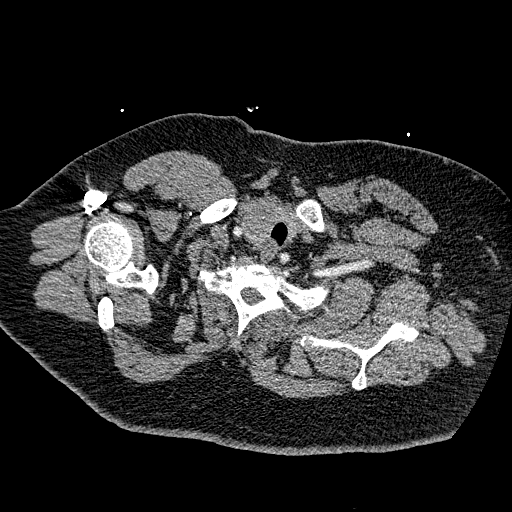

[18 of 30 positions shown; findings below may reference images not displayed]

FINDINGS: Lungs are well aerated bilaterally. Patchy multifocal infiltrates
are noted throughout both lungs some of the infiltrates have a
tree-in-bud appearance consistent with atypical infection. No focal
mass lesion is noted. Small left pleural effusion is seen. This is
not amenable to percutaneous drainage.

The thoracic inlet is within normal limits. The thoracic aorta and
its branches are unremarkable. The pulmonary artery demonstrates a
normal branching pattern without definitive filling defect to
suggest pulmonary embolism. The prominent mediastinum is related to
some degree of mediastinal lipomatosis.

There is a 17 mm short axis lymph node identified adjacent to the
esophagus and trachea on the right best seen on image number 55 of
series 5. No other significant mediastinal lymph nodes are seen.
Some small hilar lymph nodes are noted likely of reactive nature.

The visualized upper abdomen shows evidence of cholelithiasis
without complicating factors. The osseous structures show
degenerative change of the thoracic spine.

Review of the MIP images confirms the above findings.
IMPRESSION: No evidence of pulmonary emboli. No evidence of aortic dissection or
aneurysmal dilatation.

Multifocal infiltrates bilaterally with some tree-in-bud appearance.
This may be related to an atypical pneumonia such as MUIRHEAD.

Some mild lymph nodes are noted within the right hilar region and
mediastinum on the right likely of reactive nature.

## 2017-03-13 IMAGING — DX DG CHEST 1V PORT
1 series · 1 of 1 positions shown · non-contrast
Comparison: Prior chest x-ray 02/23/2016

CLINICAL DATA: 64-year-old male with ventilator associated
pneumonia

EXAM:
PORTABLE CHEST 1 VIEW

[chest ap]
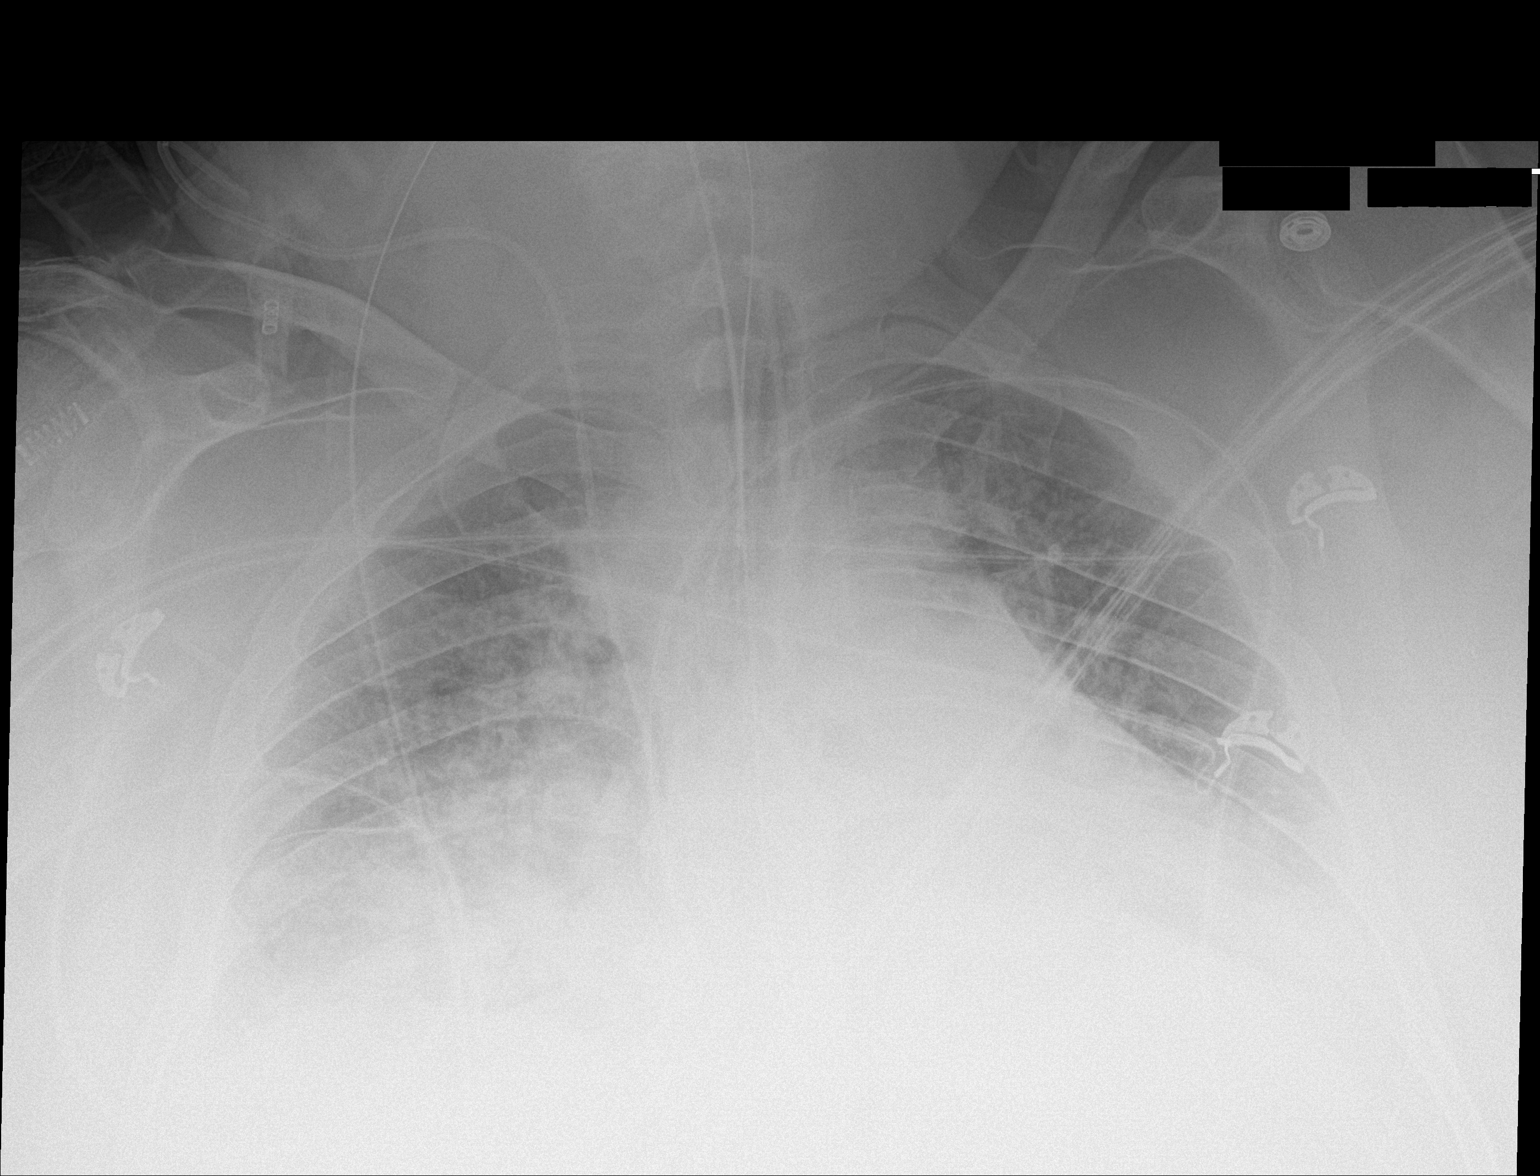

[1 of 1 positions shown; findings below may reference images not displayed]

FINDINGS: The endotracheal tube is 3.1 cm above the carina. A nasogastric tube
is present. The tip is not visualized but lies below the diaphragm
presumably within the stomach. There is similar appearance of the
chest with very low inspiratory volumes and right greater than left
bibasilar opacities. No edema. Pneumothorax. Right IJ central venous
catheter remains in unchanged position with the tip overlying the
superior cavoatrial junction. No acute osseous abnormality.
IMPRESSION: 1. Stable and satisfactory support apparatus.
2. Similar appearance of the chest with low inspiratory volumes and
right greater than left airspace opacities concerning for multi
focal pneumonia or atypical respiratory infection.

## 2017-03-18 IMAGING — DX DG CHEST 1V PORT
1 series · 1 of 1 positions shown · non-contrast
Comparison: 02/27/2016 chest radiograph.

CLINICAL DATA: Intubated.  Pneumonia.

EXAM:
PORTABLE CHEST 1 VIEW

[chest ap]
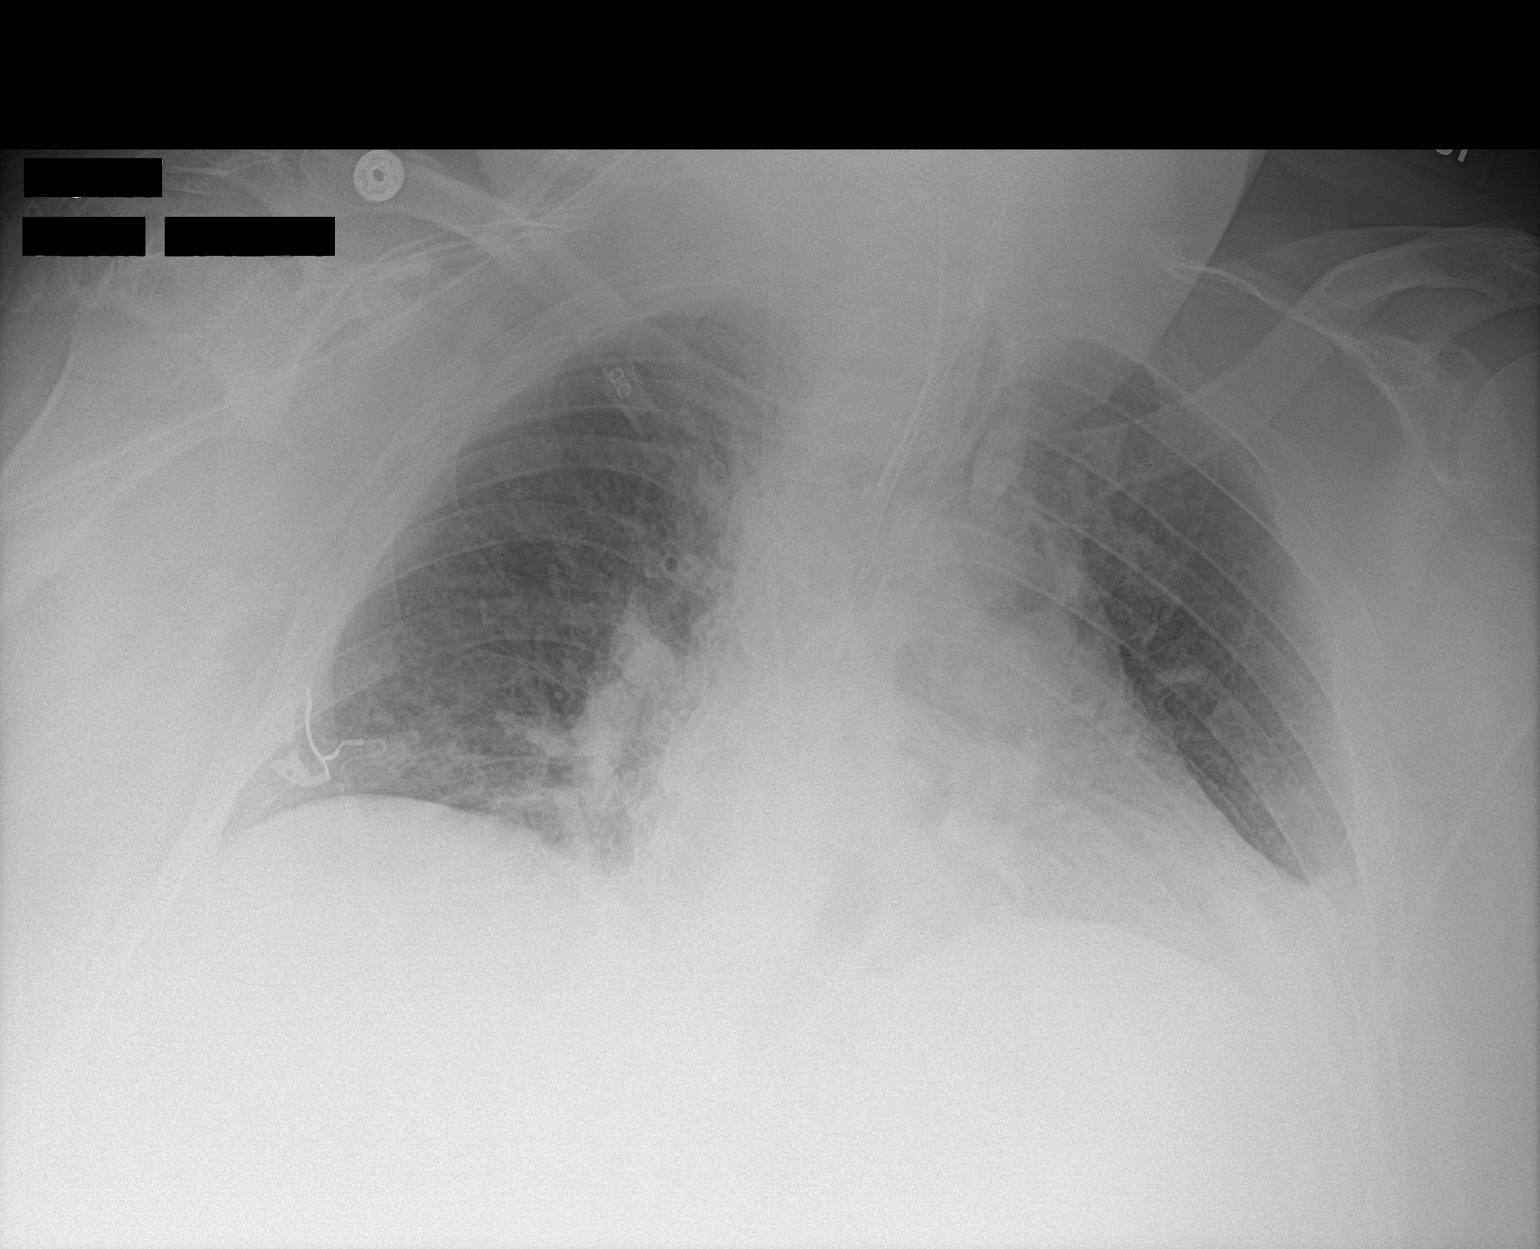

[1 of 1 positions shown; findings below may reference images not displayed]

FINDINGS: Endotracheal tube tip is 3.3 cm above the carina. Enteric tube
enters the stomach, with the tip not seen on this image. Stable
cardiomediastinal silhouette with top-normal heart size. No
pneumothorax. No pleural effusion. Low lung volumes. Patchy
bibasilar lung opacities, slightly increased on the left. Vascular
crowding with no overt pulmonary edema.
IMPRESSION: 1. Well-positioned endotracheal and enteric tubes.
2. Low lung volumes with patchy bibasilar lung opacities, slightly
increased on the left, favor atelectasis, cannot exclude a component
of pneumonia.

## 2018-04-07 ENCOUNTER — Other Ambulatory Visit: Payer: Self-pay

## 2018-04-07 ENCOUNTER — Emergency Department: Payer: Medicare Other

## 2018-04-07 ENCOUNTER — Inpatient Hospital Stay
Admission: EM | Admit: 2018-04-07 | Discharge: 2018-04-12 | DRG: 189 | Disposition: A | Discharge status: Home Health | Payer: Medicare Other | Attending: Internal Medicine | Admitting: Internal Medicine

## 2018-04-07 ENCOUNTER — Inpatient Hospital Stay: Payer: Medicare Other

## 2018-04-07 ENCOUNTER — Encounter: Payer: Self-pay | Admitting: Emergency Medicine

## 2018-04-07 DIAGNOSIS — E872 Acidosis: Secondary | ICD-10-CM | POA: Diagnosis present

## 2018-04-07 DIAGNOSIS — J9602 Acute respiratory failure with hypercapnia: Secondary | ICD-10-CM | POA: Diagnosis not present

## 2018-04-07 DIAGNOSIS — D649 Anemia, unspecified: Secondary | ICD-10-CM | POA: Diagnosis present

## 2018-04-07 DIAGNOSIS — J9811 Atelectasis: Secondary | ICD-10-CM | POA: Diagnosis present

## 2018-04-07 DIAGNOSIS — E662 Morbid (severe) obesity with alveolar hypoventilation: Secondary | ICD-10-CM | POA: Diagnosis present

## 2018-04-07 DIAGNOSIS — Z9181 History of falling: Secondary | ICD-10-CM | POA: Diagnosis not present

## 2018-04-07 DIAGNOSIS — N183 Chronic kidney disease, stage 3 (moderate): Secondary | ICD-10-CM | POA: Diagnosis present

## 2018-04-07 DIAGNOSIS — I1310 Hypertensive heart and chronic kidney disease without heart failure, with stage 1 through stage 4 chronic kidney disease, or unspecified chronic kidney disease: Secondary | ICD-10-CM | POA: Diagnosis present

## 2018-04-07 DIAGNOSIS — N179 Acute kidney failure, unspecified: Secondary | ICD-10-CM | POA: Diagnosis present

## 2018-04-07 DIAGNOSIS — G47419 Narcolepsy without cataplexy: Secondary | ICD-10-CM | POA: Diagnosis present

## 2018-04-07 DIAGNOSIS — Z803 Family history of malignant neoplasm of breast: Secondary | ICD-10-CM | POA: Diagnosis not present

## 2018-04-07 DIAGNOSIS — J441 Chronic obstructive pulmonary disease with (acute) exacerbation: Secondary | ICD-10-CM | POA: Diagnosis present

## 2018-04-07 DIAGNOSIS — K219 Gastro-esophageal reflux disease without esophagitis: Secondary | ICD-10-CM | POA: Diagnosis present

## 2018-04-07 DIAGNOSIS — Z9981 Dependence on supplemental oxygen: Secondary | ICD-10-CM | POA: Diagnosis not present

## 2018-04-07 DIAGNOSIS — Z79899 Other long term (current) drug therapy: Secondary | ICD-10-CM | POA: Diagnosis not present

## 2018-04-07 DIAGNOSIS — J189 Pneumonia, unspecified organism: Secondary | ICD-10-CM | POA: Diagnosis present

## 2018-04-07 DIAGNOSIS — E162 Hypoglycemia, unspecified: Secondary | ICD-10-CM | POA: Diagnosis present

## 2018-04-07 DIAGNOSIS — J9622 Acute and chronic respiratory failure with hypercapnia: Principal | ICD-10-CM | POA: Diagnosis present

## 2018-04-07 DIAGNOSIS — Z791 Long term (current) use of non-steroidal anti-inflammatories (NSAID): Secondary | ICD-10-CM

## 2018-04-07 DIAGNOSIS — G9340 Encephalopathy, unspecified: Secondary | ICD-10-CM | POA: Diagnosis present

## 2018-04-07 DIAGNOSIS — J440 Chronic obstructive pulmonary disease with acute lower respiratory infection: Secondary | ICD-10-CM | POA: Diagnosis present

## 2018-04-07 DIAGNOSIS — R17 Unspecified jaundice: Secondary | ICD-10-CM | POA: Diagnosis present

## 2018-04-07 DIAGNOSIS — E785 Hyperlipidemia, unspecified: Secondary | ICD-10-CM | POA: Diagnosis present

## 2018-04-07 DIAGNOSIS — J9621 Acute and chronic respiratory failure with hypoxia: Secondary | ICD-10-CM | POA: Diagnosis present

## 2018-04-07 DIAGNOSIS — W19XXXA Unspecified fall, initial encounter: Secondary | ICD-10-CM | POA: Diagnosis present

## 2018-04-07 DIAGNOSIS — R4182 Altered mental status, unspecified: Secondary | ICD-10-CM

## 2018-04-07 DIAGNOSIS — Z6841 Body Mass Index (BMI) 40.0 and over, adult: Secondary | ICD-10-CM | POA: Diagnosis not present

## 2018-04-07 DIAGNOSIS — J9692 Respiratory failure, unspecified with hypercapnia: Secondary | ICD-10-CM | POA: Diagnosis present

## 2018-04-07 LAB — CBC
HCT: 38.5 % — ABNORMAL LOW (ref 40.0–52.0)
Hemoglobin: 12.5 g/dL — ABNORMAL LOW (ref 13.0–18.0)
MCH: 28.6 pg (ref 26.0–34.0)
MCHC: 32.5 g/dL (ref 32.0–36.0)
MCV: 87.9 fL (ref 80.0–100.0)
PLATELETS: 163 10*3/uL (ref 150–440)
RBC: 4.38 MIL/uL — AB (ref 4.40–5.90)
RDW: 17.4 % — ABNORMAL HIGH (ref 11.5–14.5)
WBC: 7.3 10*3/uL (ref 3.8–10.6)

## 2018-04-07 LAB — URINALYSIS, COMPLETE (UACMP) WITH MICROSCOPIC
BACTERIA UA: NONE SEEN
Glucose, UA: NEGATIVE mg/dL
Hgb urine dipstick: NEGATIVE
Ketones, ur: 20 mg/dL — AB
Leukocytes, UA: NEGATIVE
Nitrite: NEGATIVE
PROTEIN: 30 mg/dL — AB
SPECIFIC GRAVITY, URINE: 1.02 (ref 1.005–1.030)
pH: 8 (ref 5.0–8.0)

## 2018-04-07 LAB — CBC WITH DIFFERENTIAL/PLATELET
BASOS ABS: 0.1 10*3/uL (ref 0–0.1)
Basophils Relative: 1 %
Eosinophils Absolute: 0.1 10*3/uL (ref 0–0.7)
Eosinophils Relative: 1 %
HEMATOCRIT: 36.9 % — AB (ref 40.0–52.0)
HEMOGLOBIN: 12 g/dL — AB (ref 13.0–18.0)
LYMPHS PCT: 9 %
Lymphs Abs: 0.7 10*3/uL — ABNORMAL LOW (ref 1.0–3.6)
MCH: 28.8 pg (ref 26.0–34.0)
MCHC: 32.6 g/dL (ref 32.0–36.0)
MCV: 88.3 fL (ref 80.0–100.0)
MONO ABS: 0.6 10*3/uL (ref 0.2–1.0)
Monocytes Relative: 7 %
NEUTROS ABS: 6.4 10*3/uL (ref 1.4–6.5)
NEUTROS PCT: 82 %
Platelets: 170 10*3/uL (ref 150–440)
RBC: 4.18 MIL/uL — AB (ref 4.40–5.90)
RDW: 17.3 % — ABNORMAL HIGH (ref 11.5–14.5)
WBC: 7.8 10*3/uL (ref 3.8–10.6)

## 2018-04-07 LAB — COMPREHENSIVE METABOLIC PANEL
ALK PHOS: 72 U/L (ref 38–126)
ALT: 10 U/L — AB (ref 17–63)
AST: 23 U/L (ref 15–41)
Albumin: 3.6 g/dL (ref 3.5–5.0)
Anion gap: 12 (ref 5–15)
BILIRUBIN TOTAL: 1.7 mg/dL — AB (ref 0.3–1.2)
BUN: 19 mg/dL (ref 6–20)
CO2: 38 mmol/L — ABNORMAL HIGH (ref 22–32)
CREATININE: 0.89 mg/dL (ref 0.61–1.24)
Calcium: 8.8 mg/dL — ABNORMAL LOW (ref 8.9–10.3)
Chloride: 93 mmol/L — ABNORMAL LOW (ref 101–111)
GFR calc Af Amer: >60 mL/min (ref >60)
GFR calc non Af Amer: >60 mL/min (ref >60)
Glucose, Bld: 92 mg/dL (ref 65–99)
Potassium: 3.7 mmol/L (ref 3.5–5.1)
Sodium: 143 mmol/L (ref 135–145)
TOTAL PROTEIN: 6.4 g/dL — AB (ref 6.5–8.1)

## 2018-04-07 LAB — BLOOD GAS, VENOUS
Acid-Base Excess: 20.2 mmol/L — ABNORMAL HIGH (ref 0.0–2.0)
BICARBONATE: 50.7 mmol/L — AB (ref 20.0–28.0)
FIO2: 0.28
O2 SAT: 78.9 %
PATIENT TEMPERATURE: 37
pCO2, Ven: 94 mmHg (ref 44.0–60.0)
pH, Ven: 7.34 (ref 7.250–7.430)
pO2, Ven: 46 mmHg — ABNORMAL HIGH (ref 32.0–45.0)

## 2018-04-07 LAB — BLOOD GAS, ARTERIAL
Acid-Base Excess: 19.6 mmol/L — ABNORMAL HIGH (ref 0.0–2.0)
BICARBONATE: 48.2 mmol/L — AB (ref 20.0–28.0)
Delivery systems: POSITIVE
EXPIRATORY PAP: 8
FIO2: 0.24
INSPIRATORY PAP: 20
O2 SAT: 83 %
PCO2 ART: 76 mmHg — AB (ref 32.0–48.0)
PO2 ART: 47 mmHg — AB (ref 83.0–108.0)
Patient temperature: 37
pH, Arterial: 7.41 (ref 7.350–7.450)

## 2018-04-07 LAB — MRSA PCR SCREENING: MRSA BY PCR: NEGATIVE

## 2018-04-07 LAB — TROPONIN I

## 2018-04-07 LAB — AMMONIA: Ammonia: 18 umol/L (ref 9–35)

## 2018-04-07 LAB — CREATININE, SERUM
Creatinine, Ser: 0.85 mg/dL (ref 0.61–1.24)
GFR calc non Af Amer: >60 mL/min (ref >60)

## 2018-04-07 LAB — CK: CK TOTAL: 80 U/L (ref 49–397)

## 2018-04-07 LAB — GLUCOSE, CAPILLARY: GLUCOSE-CAPILLARY: 95 mg/dL (ref 65–99)

## 2018-04-07 MED ORDER — ONDANSETRON HCL 4 MG/2ML IJ SOLN: 4.0000 mg | Freq: Four times a day (QID) | INTRAMUSCULAR | Status: DC | PRN

## 2018-04-07 MED ORDER — LEVOFLOXACIN IN D5W 750 MG/150ML IV SOLN
750.0000 mg | INTRAVENOUS | Status: DC
  Filled 2018-04-07: qty 150

## 2018-04-07 MED ORDER — BUDESONIDE 0.25 MG/2ML IN SUSP
0.2500 mg | Freq: Two times a day (BID) | RESPIRATORY_TRACT | Status: DC
  Administered 2018-04-07 – 2018-04-12 (×10): 0.25 mg via RESPIRATORY_TRACT
  Filled 2018-04-07 (×10): qty 2

## 2018-04-07 MED ORDER — ACETAMINOPHEN 650 MG RE SUPP: 650.0000 mg | Freq: Four times a day (QID) | RECTAL | Status: DC | PRN

## 2018-04-07 MED ORDER — LEVOFLOXACIN IN D5W 500 MG/100ML IV SOLN
500.0000 mg | Freq: Once | INTRAVENOUS | Status: AC
  Administered 2018-04-07: 500 mg via INTRAVENOUS
  Filled 2018-04-07: qty 100

## 2018-04-07 MED ORDER — ONDANSETRON HCL 4 MG PO TABS: 4.0000 mg | ORAL_TABLET | Freq: Four times a day (QID) | ORAL | Status: DC | PRN

## 2018-04-07 MED ORDER — TRAZODONE HCL 50 MG PO TABS
25.0000 mg | ORAL_TABLET | Freq: Every evening | ORAL | Status: DC | PRN
  Administered 2018-04-09 – 2018-04-11 (×2): 25 mg via ORAL
  Filled 2018-04-07 (×2): qty 1

## 2018-04-07 MED ORDER — CEFTRIAXONE SODIUM 1 G IJ SOLR: 1.0000 g | INTRAMUSCULAR | Status: DC

## 2018-04-07 MED ORDER — MODAFINIL 100 MG PO TABS
200.0000 mg | ORAL_TABLET | Freq: Every day | ORAL | Status: DC
  Administered 2018-04-09 – 2018-04-12 (×4): 200 mg via ORAL
  Filled 2018-04-07 (×4): qty 2

## 2018-04-07 MED ORDER — ACETAMINOPHEN 325 MG PO TABS: 650.0000 mg | ORAL_TABLET | Freq: Four times a day (QID) | ORAL | Status: DC | PRN

## 2018-04-07 MED ORDER — HYDROCODONE-ACETAMINOPHEN 5-325 MG PO TABS
1.0000 | ORAL_TABLET | ORAL | Status: DC | PRN
Start: 2018-04-07 — End: 2018-04-07

## 2018-04-07 MED ORDER — ALLOPURINOL 100 MG PO TABS
300.0000 mg | ORAL_TABLET | Freq: Every day | ORAL | Status: DC
  Administered 2018-04-09 – 2018-04-12 (×4): 300 mg via ORAL
  Filled 2018-04-07 (×4): qty 3

## 2018-04-07 MED ORDER — ENOXAPARIN SODIUM 40 MG/0.4ML ~~LOC~~ SOLN
40.0000 mg | Freq: Two times a day (BID) | SUBCUTANEOUS | Status: DC
  Administered 2018-04-07 – 2018-04-12 (×10): 40 mg via SUBCUTANEOUS
  Filled 2018-04-07 (×10): qty 0.4

## 2018-04-07 MED ORDER — IPRATROPIUM-ALBUTEROL 0.5-2.5 (3) MG/3ML IN SOLN
3.0000 mL | Freq: Once | RESPIRATORY_TRACT | Status: AC
  Administered 2018-04-07: 3 mL via RESPIRATORY_TRACT
  Filled 2018-04-07: qty 3

## 2018-04-07 MED ORDER — PANTOPRAZOLE SODIUM 40 MG PO TBEC
40.0000 mg | DELAYED_RELEASE_TABLET | Freq: Every day | ORAL | Status: DC
  Administered 2018-04-09 – 2018-04-12 (×4): 40 mg via ORAL
  Filled 2018-04-07 (×4): qty 1

## 2018-04-07 MED ORDER — METHYLPREDNISOLONE SODIUM SUCC 125 MG IJ SOLR
125.0000 mg | Freq: Once | INTRAMUSCULAR | Status: AC
  Administered 2018-04-07: 125 mg via INTRAVENOUS
  Filled 2018-04-07: qty 2

## 2018-04-07 MED ORDER — DOCUSATE SODIUM 100 MG PO CAPS
100.0000 mg | ORAL_CAPSULE | Freq: Two times a day (BID) | ORAL | Status: DC
  Administered 2018-04-08 – 2018-04-12 (×8): 100 mg via ORAL
  Filled 2018-04-07 (×8): qty 1

## 2018-04-07 MED ORDER — DEXTROSE 50 % IV SOLN
2.0000 | Freq: Once | INTRAVENOUS | Status: AC
  Administered 2018-04-07: 50 mL via INTRAVENOUS

## 2018-04-07 MED ORDER — SODIUM CHLORIDE 0.9 % IV SOLN
1.0000 g | INTRAVENOUS | Status: DC
  Administered 2018-04-08: 1 g via INTRAVENOUS
  Filled 2018-04-07: qty 1
  Filled 2018-04-07: qty 10

## 2018-04-07 MED ORDER — AZITHROMYCIN 500 MG IV SOLR
500.0000 mg | INTRAVENOUS | Status: DC
Start: 2018-04-08 — End: 2018-04-08
  Administered 2018-04-08: 500 mg via INTRAVENOUS
  Filled 2018-04-07 (×2): qty 500

## 2018-04-07 MED ORDER — DEXTROSE 50 % IV SOLN
INTRAVENOUS | Status: AC
  Filled 2018-04-07: qty 50

## 2018-04-07 MED ORDER — METHYLPREDNISOLONE SODIUM SUCC 125 MG IJ SOLR
60.0000 mg | Freq: Three times a day (TID) | INTRAMUSCULAR | Status: DC
  Administered 2018-04-07 – 2018-04-09 (×6): 60 mg via INTRAVENOUS
  Filled 2018-04-07 (×6): qty 2

## 2018-04-07 MED ORDER — BISACODYL 5 MG PO TBEC: 5.0000 mg | DELAYED_RELEASE_TABLET | Freq: Every day | ORAL | Status: DC | PRN

## 2018-04-07 MED ORDER — SODIUM CHLORIDE 0.9 % IV SOLN
INTRAVENOUS | Status: DC
  Administered 2018-04-07: 17:00:00 via INTRAVENOUS

## 2018-04-07 NOTE — ED Notes (Signed)
Patient transported to X-ray 

## 2018-04-07 NOTE — ED Triage Notes (Signed)
Patient was taken to shower on arrival as he smelled strongly of urine and house was reportedly unlivable with pails of urine all over.  The patient was found on bed and was unable to get up.  He was not able to answer orientation questions so they brought him in.  Per [facility] social services and aps have been informed of home situation.  The patient says he has fallen a few times in the past few days.;  He says he has a sister who lives nearby, but he resides by himself.  Patient has small scratches all over, but skin is in fairly good condition. Has reddened areas under his right breast area and under peniculum area.  Peri area is red, but no broken skin.

## 2018-04-07 NOTE — Progress Notes (Signed)
Pt trailed off BiPAP, on 6L [place] pt couldn't maintain sats, placed back on 30% BiPAP, sats normalized.  Pt resting comfortably. Will continue to monitor.

## 2018-04-07 NOTE — ED Notes (Signed)
When we brought pt to room he was on 2 liter oxygen as at home.  His pulse ox was 86 on that.   Went up to 3 liters and ox went to 88 so I went to 4 liters and ox came up to 94-96%.  He says he has been having twintges in his heart.

## 2018-04-07 NOTE — H&P (Signed)
[facility] - [place] at [facility]   PATIENT NAME: Dylan Kennedy    MR#:  [id number]  DATE OF BIRTH:  03-16-1951  DATE OF ADMISSION:  04/07/2018  PRIMARY CARE PHYSICIAN: Mick Sell, MD   REQUESTING/REFERRING PHYSICIAN: Willy Eddy  CHIEF COMPLAINT: Altered mental status   Chief Complaint  Patient presents with  . Flank Pain  . Altered Mental Status    HISTORY OF PRESENT ILLNESS:  Dylan Kennedy  is a 67 y.o. male with a known history of morbid obesity, COPD, essential hypertension, history of multiple falls, poor living situation , reportedly called [facility] department, patient was found on the floor and they called the ambulance. During documentation patient had left hip pain so left hip x-ray was done in the emergency room she did not show any fractures.  Started on BiPAP around 2:30 PM. PAST MEDICAL HISTORY:   Past Medical History:  Diagnosis Date  . Acute hypercapnic respiratory failure (HCC) 02/2016  . CKD (chronic kidney disease)   . Exogenous obesity   . GERD (gastroesophageal reflux disease)   . Gout   . Hyperlipidemia   . Hypertension   . Morbid obesity (HCC)   . Pneumonia 02/2016    PAST SURGICAL HISTOIRY:   Past Surgical History:  Procedure Laterality Date  . COLONOSCOPY    . SHOULDER SURGERY     right  . TONSILLECTOMY      SOCIAL HISTORY:   Social History   Tobacco Use  . Smoking status: Never Smoker  . Smokeless tobacco: Never Used  Substance Use Topics  . Alcohol use: No    Alcohol/week: 0.0 oz    FAMILY HISTORY:   Family History  Problem Relation Age of Onset  . Breast cancer Mother   . Bone cancer Father     DRUG ALLERGIES:   Allergies  Allergen Reactions  . Bee Venom Hives and Itching    REVIEW OF SYSTEMS:  Altered mental status, unable to obtain review of systems .  MEDICATIONS AT HOME:   Prior to Admission medications   Medication Sig Start Date End Date Taking? Authorizing  Provider  allopurinol (ZYLOPRIM) 300 MG tablet Take 300 mg by mouth daily.    Yes [provider]  cholecalciferol (VITAMIN D) 1000 units tablet Take 2,000 Units by mouth daily.    Yes [provider]  furosemide (LASIX) 40 MG tablet Take 1 tablet by mouth daily. 03/13/18  Yes [provider]  lisinopril (PRINIVIL,ZESTRIL) 10 MG tablet Take 10 mg by mouth daily.   Yes [provider]  meloxicam (MOBIC) 7.5 MG tablet Take 1 tablet by mouth daily. 03/06/18  Yes [provider]  modafinil (PROVIGIL) 200 MG tablet Take 1 tablet by mouth daily. 03/17/18  Yes [provider]  pantoprazole (PROTONIX) 40 MG tablet Take 40 mg by mouth daily.    Yes [provider]  azithromycin (ZITHROMAX) 250 MG tablet Take 1 tablet (250 mg total) by mouth daily. Patient not taking: Reported on 04/07/2018 05/10/16   Altamese Dilling, MD  Fluticasone-Salmeterol (ADVAIR DISKUS) 250-50 MCG/DOSE AEPB Inhale 1 puff into the lungs 2 (two) times daily. Patient not taking: Reported on 04/07/2018 05/10/16   Altamese Dilling, MD  ipratropium-albuterol (DUONEB) 0.5-2.5 (3) MG/3ML SOLN Take 3 mLs by nebulization every 6 (six) hours as needed. Patient not taking: Reported on 04/07/2018 05/10/16   Altamese Dilling, MD  predniSONE (STERAPRED UNI-PAK 21 TAB) 10 MG (21) TBPK tablet Take 6 tabs first day, 5  tab on day 2, then 4 on day 3rd, 3 tabs on day 4th , 2 tab on day 5th, and 1 tab on 6th day. Patient not taking: Reported on 04/07/2018 05/10/16   Altamese Dilling, MD  tiotropium (SPIRIVA HANDIHALER) 18 MCG inhalation capsule Place 1 capsule (18 mcg total) into inhaler and inhale every morning. Patient not taking: Reported on 04/07/2018 05/10/16   Altamese Dilling, MD      VITAL SIGNS:  Blood pressure 140/70, pulse 83, temperature 99.2 F (37.3 C), temperature source Axillary, resp. rate (!) 22, height  (1.676 m), weight (!) 145.2 kg (320 lb), SpO2  93 %.  PHYSICAL EXAMINATION:  GENERAL:  67 y.o.-year-old patient lying in the bed lethargic occasionally opens eyes to calling his name.  EYES: Pupils equal, round, reactive to light No scleral icterus. Extraocular muscles intact.  HEENT: Head atraumatic, normocephalic. Oropharynx and nasopharynx clear.  NECK:  Supple, no jugular venous distention. No thyroid enlargement, no tenderness.  LUNGS:  poorBreath sounds bilaterally.  CARDIOVASCULAR: S1, S2 normal. No murmurs, rubs, or gallops.  ABDOMEN: Soft, nontender, nondistended. Bowel sounds present. No organomegaly or mass.  EXTREMITIES: No pedal edema, cyanosis, or clubbing.  NEUROLOGIC: Lethargic  PSYCHIATRIC: lethargic.  LABORATORY PANEL:   CBC Recent Labs  Lab 04/07/18 1203  WBC 7.8  HGB 12.0*  HCT 36.9*  PLT 170   ------------------------------------------------------------------------------------------------------------------  Chemistries  Recent Labs  Lab 04/07/18 1203  NA 143  K 3.7  CL 93*  CO2 38*  GLUCOSE 92  BUN 19  CREATININE 0.89  CALCIUM 8.8*  AST 23  ALT 10*  ALKPHOS 72  BILITOT 1.7*   ------------------------------------------------------------------------------------------------------------------  Cardiac Enzymes Recent Labs  Lab 04/07/18 1203  TROPONINI <0.03   ------------------------------------------------------------------------------------------------------------------  RADIOLOGY:  Dg Chest Portable 1 View  Result Date: 04/07/2018 CLINICAL DATA:  Shortness of breath, morbid obesity. History of acute respiratory failure and pneumonia. EXAM: PORTABLE CHEST 1 VIEW COMPARISON:  Portable chest x-ray of May 09, 2016 FINDINGS: The lungs are borderline hypoinflated. Aeration of the lungs has improved since the previous study. There is hazy increased density obscuring the cardiac apex. The cardiac silhouette is mildly enlarged. The pulmonary vascularity is not engorged. The bony thorax exhibits  no acute abnormality. IMPRESSION: Borderline hypoinflated. Increased density at the left lung base laterally may be normal for the patient but could reflect atelectasis or pneumonia. No CHF. Electronically Signed   By: David  [place] M.D.   On: 04/07/2018 12:50   Dg Hip Unilat With Pelvis 2-3 Views Left  Result Date: 04/07/2018 CLINICAL DATA:  History of fall.  Pain. EXAM: DG HIP (WITH OR WITHOUT PELVIS) 2-3V LEFT COMPARISON:  No recent. FINDINGS: No acute bony or joint abnormality identified. No evidence of fracture or dislocation. Pelvic calcifications consistent phleboliths. IMPRESSION: No acute abnormality. Electronically Signed   By: Maisie Fus  Register   On: 04/07/2018 12:49    EKG:   Orders placed or performed during the hospital encounter of 04/07/18  . ED EKG  . ED EKG  . EKG 12-Lead  . EKG 12-Lead    IMPRESSION AND PLAN:   67 year old with history of hypertension, COPD, morbid obesity came in with altered mental status.  Found on the floor by EMS.  Initially [facility] department called by the patient and they found very poor living conditions.   Acute on chronic hypercapnic respiratory failure: Admit to stepdown, start bronchodilators,  continue BiPAP, spoke with intensivist on-call Dr. Jake Seats.  Repeat blood gas after 30 minutes on  BiPAP.  Discussed with respiratory therapist. Left base pneumonia: Started on Levaquin.  2.  Hold lisinopril, Lasix acute on chronic renal failure, CKD stage III:, Continue gentle hydration 3.  Narcolepsy?  And is on Provigil.  4 .multiple falls, poor living situation at home: Consulted case Production designer, theatre/television/film, and by Marylene Land, get physical therapy evaluation as well.   All the records are reviewed and case discussed with ED provider. Management plans discussed with the patient, family and they are in agreement.  CODE STATUS: Full code Discussed CODE STATUS once he is more alert. TOTAL TIME TAKING CARE OF THIS PATIENT: 55 minutes.    Katha Hamming M.D on  04/07/2018 at 3:20 PM  Between 7am to 6pm - Pager - [phone]  After 6pm go to [email or web address] - password [email or web address]  [facility]  Office  [phone]  CC: Primary care physician; Mick Sell, MD  Note: This dictation was prepared with Dragon dictation along with smaller phrase technology. Any transcriptional errors that result from this process are unintentional.

## 2018-04-07 NOTE — ED Notes (Signed)
Pt stated back pain in triage but denies back pain at this time.

## 2018-04-07 NOTE — ED Notes (Signed)
Patient transported to CT 

## 2018-04-07 NOTE — ED Provider Notes (Signed)
[facility] Emergency Department Provider Note    First MD Initiated Contact with Patient 04/07/18 1115     (approximate)  I have reviewed the triage vital signs and the nursing notes.   HISTORY  Chief Complaint Flank Pain and Altered Mental Status    HPI Dennise Bamber is a 67 y.o. male multiple chronic comorbidities lives at home on 2 L oxygen presents to the ER via EMS.  Initially the cough as the patient fell in between his bed landing on his left side was complaining of left hip pain and could not get back into bed.  He called police to get help getting back in his bed when they showed up their concern for the safety the patient due to poor living conditions and APS and DSS was called.  EMS staff asked the patient what month it was and he said April so they brought him to the ER for altered mental status.  On arrival patient denies any complaints.  States he does have some residual pain in the left hip but that is improving.  Denies any recent fevers.  Past Medical History:  Diagnosis Date  . Acute hypercapnic respiratory failure (HCC) 02/2016  . CKD (chronic kidney disease)   . Exogenous obesity   . GERD (gastroesophageal reflux disease)   . Gout   . Hyperlipidemia   . Hypertension   . Morbid obesity (HCC)   . Pneumonia 02/2016   Family History  Problem Relation Age of Onset  . Breast cancer Mother   . Bone cancer Father    Past Surgical History:  Procedure Laterality Date  . COLONOSCOPY    . SHOULDER SURGERY     right  . TONSILLECTOMY     Patient Active Problem List   Diagnosis Date Noted  . Hypercapnic respiratory failure (HCC) 04/07/2018  . COPD exacerbation (HCC) 05/07/2016  . Morbid obesity with BMI of 40.0-44.9, adult (HCC) 03/02/2016  . Endotracheally intubated   . Acute hypercapnic respiratory failure (HCC) 02/24/2016  . CKD (chronic kidney disease) stage 3, GFR 30-59 ml/min (HCC) 02/24/2016  . Hypokalemia 02/24/2016  .  Essential hypertension 02/24/2016  . Gout 02/24/2016  . [place] intertrigo 02/24/2016  . Acute respiratory failure with hypoxia (HCC)   . Community acquired pneumonia   . Sepsis (HCC)   . Septic shock (HCC)       Prior to Admission medications   Medication Sig Start Date End Date Taking? Authorizing Provider  allopurinol (ZYLOPRIM) 300 MG tablet Take 300 mg by mouth daily.    Yes [provider]  cholecalciferol (VITAMIN D) 1000 units tablet Take 2,000 Units by mouth daily.    Yes [provider]  furosemide (LASIX) 40 MG tablet Take 1 tablet by mouth daily. 03/13/18  Yes [provider]  lisinopril (PRINIVIL,ZESTRIL) 10 MG tablet Take 10 mg by mouth daily.   Yes [provider]  meloxicam (MOBIC) 7.5 MG tablet Take 1 tablet by mouth daily. 03/06/18  Yes [provider]  modafinil (PROVIGIL) 200 MG tablet Take 1 tablet by mouth daily. 03/17/18  Yes [provider]  pantoprazole (PROTONIX) 40 MG tablet Take 40 mg by mouth daily.    Yes [provider]  azithromycin (ZITHROMAX) 250 MG tablet Take 1 tablet (250 mg total) by mouth daily. Patient not taking: Reported on 04/07/2018 05/10/16   Altamese Dilling, MD  Fluticasone-Salmeterol (ADVAIR DISKUS) 250-50 MCG/DOSE AEPB Inhale 1 puff into the lungs 2 (two) times daily. Patient  not taking: Reported on 04/07/2018 05/10/16   Altamese Dilling, MD  ipratropium-albuterol (DUONEB) 0.5-2.5 (3) MG/3ML SOLN Take 3 mLs by nebulization every 6 (six) hours as needed. Patient not taking: Reported on 04/07/2018 05/10/16   Altamese Dilling, MD  predniSONE (STERAPRED UNI-PAK 21 TAB) 10 MG (21) TBPK tablet Take 6 tabs first day, 5 tab on day 2, then 4 on day 3rd, 3 tabs on day 4th , 2 tab on day 5th, and 1 tab on 6th day. Patient not taking: Reported on 04/07/2018 05/10/16   Altamese Dilling, MD  tiotropium (SPIRIVA HANDIHALER) 18 MCG inhalation capsule Place 1 capsule (18 mcg total)  into inhaler and inhale every morning. Patient not taking: Reported on 04/07/2018 05/10/16   Altamese Dilling, MD    Allergies Bee venom    Social History Social History   Tobacco Use  . Smoking status: Never Smoker  . Smokeless tobacco: Never Used  Substance Use Topics  . Alcohol use: No    Alcohol/week: 0.0 oz  . Drug use: No    Review of Systems Patient denies headaches, rhinorrhea, blurry vision, numbness, shortness of breath, chest pain, edema, cough, abdominal pain, nausea, vomiting, diarrhea, dysuria, fevers, rashes or hallucinations unless otherwise stated above in HPI. ____________________________________________   PHYSICAL EXAM:  VITAL SIGNS: Vitals:   04/07/18 1202 04/07/18 1400  BP:  140/70  Pulse:  83  Resp:  (!) 22  Temp: 99.2 F (37.3 C)   SpO2:  93%    Constitutional: Alert but disoriented x 2, ill appearing Eyes: Conjunctivae are normal.  Head: Atraumatic. Nose: No congestion/rhinnorhea. Mouth/Throat: Mucous membranes are moist.   Neck: No stridor. Painless ROM.  Cardiovascular: Normal rate, regular rhythm. Grossly normal heart sounds.  Good peripheral circulation. Respiratory: tachypnea with + use of accessory muscles, diminished bibasilar bs Gastrointestinal: Soft and nontender. No distention. No abdominal bruits. No CVA tenderness. Genitourinary: deferred Musculoskeletal: No lower extremity tenderness, 2+ BLE edema.  No joint effusions. Neurologic:  Normal speech and language.  No facial droop Skin:  Skin is warm, dry and intact. No rash noted.  ____________________________________________   LABS (all labs ordered are listed, but only abnormal results are displayed)  Results for orders placed or performed during the hospital encounter of 04/07/18 (from the past 24 hour(s))  CBC with Differential/Platelet     Status: Abnormal   Collection Time: 04/07/18 12:03 PM  Result Value Ref Range   WBC 7.8 3.8 - 10.6 K/uL   RBC 4.18 (L) 4.40  - 5.90 MIL/uL   Hemoglobin 12.0 (L) 13.0 - 18.0 g/dL   HCT 16.1 (L) 09.6 - 04.5 %   MCV 88.3 80.0 - 100.0 fL   MCH 28.8 26.0 - 34.0 pg   MCHC 32.6 32.0 - 36.0 g/dL   RDW 40.9 (H) 81.1 - 91.4 %   Platelets 170 150 - 440 K/uL   Neutrophils Relative % 82 %   Neutro Abs 6.4 1.4 - 6.5 K/uL   Lymphocytes Relative 9 %   Lymphs Abs 0.7 (L) 1.0 - 3.6 K/uL   Monocytes Relative 7 %   Monocytes Absolute 0.6 0.2 - 1.0 K/uL   Eosinophils Relative 1 %   Eosinophils Absolute 0.1 0 - 0.7 K/uL   Basophils Relative 1 %   Basophils Absolute 0.1 0 - 0.1 K/uL  Comprehensive metabolic panel     Status: Abnormal   Collection Time: 04/07/18 12:03 PM  Result Value Ref Range   Sodium 143 135 - 145 mmol/L  Potassium 3.7 3.5 - 5.1 mmol/L   Chloride 93 (L) 101 - 111 mmol/L   CO2 38 (H) 22 - 32 mmol/L   Glucose, Bld 92 65 - 99 mg/dL   BUN 19 6 - 20 mg/dL   Creatinine, Ser 1.61 0.61 - 1.24 mg/dL   Calcium 8.8 (L) 8.9 - 10.3 mg/dL   Total Protein 6.4 (L) 6.5 - 8.1 g/dL   Albumin 3.6 3.5 - 5.0 g/dL   AST 23 15 - 41 U/L   ALT 10 (L) 17 - 63 U/L   Alkaline Phosphatase 72 38 - 126 U/L   Total Bilirubin 1.7 (H) 0.3 - 1.2 mg/dL   GFR calc non Af Amer >60 >60 mL/min   GFR calc Af Amer >60 >60 mL/min   Anion gap 12 5 - 15  Ammonia     Status: None   Collection Time: 04/07/18 12:03 PM  Result Value Ref Range   Ammonia 18 9 - 35 umol/L  Urinalysis, Complete w Microscopic     Status: Abnormal   Collection Time: 04/07/18 12:03 PM  Result Value Ref Range   Color, Urine AMBER (A) YELLOW   APPearance CLEAR (A) CLEAR   Specific Gravity, Urine 1.020 1.005 - 1.030   pH 8.0 5.0 - 8.0   Glucose, UA NEGATIVE NEGATIVE mg/dL   Hgb urine dipstick NEGATIVE NEGATIVE   Bilirubin Urine SMALL (A) NEGATIVE   Ketones, ur 20 (A) NEGATIVE mg/dL   Protein, ur 30 (A) NEGATIVE mg/dL   Nitrite NEGATIVE NEGATIVE   Leukocytes, UA NEGATIVE NEGATIVE   RBC / HPF 0-5 0 - 5 RBC/hpf   WBC, UA 0-5 0 - 5 WBC/hpf   Bacteria, UA NONE SEEN  NONE SEEN   Squamous Epithelial / LPF 0-5 0 - 5   Mucus PRESENT    Hyaline Casts, UA PRESENT   Troponin I     Status: None   Collection Time: 04/07/18 12:03 PM  Result Value Ref Range   Troponin I <0.03 <0.03 ng/mL  Blood gas, venous     Status: Abnormal   Collection Time: 04/07/18 12:03 PM  Result Value Ref Range   FIO2 0.28    Delivery systems NASAL CANNULA    pH, Ven 7.34 7.250 - 7.430   pCO2, Ven 94 (HH) 44.0 - 60.0 mmHg   pO2, Ven 46.0 (H) 32.0 - 45.0 mmHg   Bicarbonate 50.7 (H) 20.0 - 28.0 mmol/L   Acid-Base Excess 20.2 (H) 0.0 - 2.0 mmol/L   O2 Saturation 78.9 %   Patient temperature 37.0    Collection site VENOUS    Sample type VENOUS   CK     Status: None   Collection Time: 04/07/18 12:03 PM  Result Value Ref Range   Total CK 80 49 - 397 U/L   ____________________________________________  EKG My review and personal interpretation at Time: 11:34   Indication: ams  Rate: 70  Rhythm: sinus Axis: normal Other: normal intervals, no stemi ____________________________________________  RADIOLOGY  I personally reviewed all radiographic images ordered to evaluate for the above acute complaints and reviewed radiology reports and findings.  These findings were personally discussed with the patient.  Please see medical record for radiology report.  ____________________________________________   PROCEDURES  Procedure(s) performed:  .Critical Care Performed by: Willy Eddy, MD Authorized by: Willy Eddy, MD   Critical care provider statement:    Critical care time (minutes):  35   Critical care time was exclusive of:  Separately billable procedures and  treating other patients   Critical care was necessary to treat or prevent imminent or life-threatening deterioration of the following conditions:  Respiratory failure   Critical care was time spent personally by me on the following activities:  Development of treatment plan with patient or surrogate,  discussions with consultants, evaluation of patient's response to treatment, examination of patient, obtaining history from patient or surrogate, ordering and performing treatments and interventions, ordering and review of laboratory studies, ordering and review of radiographic studies, pulse oximetry, re-evaluation of patient's condition and review of old charts      Critical Care performed: yes ____________________________________________   INITIAL IMPRESSION / ASSESSMENT AND PLAN / ED COURSE  Pertinent labs & imaging results that were available during my care of the patient were reviewed by me and considered in my medical decision making (see chart for details).  DDX: Dehydration, sepsis, pna, uti, hypoglycemia, cva, drug effect, withdrawal, encephalitis   Samil Mecham is a 67 y.o. who presents to the ED with symptoms as described above.  Patient is slightly encephalopathic with extensive list of chronic comorbidities.  At this point differential is extensive.  Blood work will be sent for the above differential.  The patient will be placed on continuous pulse oximetry and telemetry for monitoring.  Laboratory evaluation will be sent to evaluate for the above complaints.     Clinical Course as of Apr 07 1452  Tue Apr 07, 2018  1424 Blood work shows Patient with compensated but worsening hypercapnia and given altered mental status I am concern for hypercapnic encephalopathy.  He is partially compensated so may be partly at baseline however difficult to discern based on presentation.  Will start on antibiotics for COPD exacerbation.  Based on presentation do feel patient will require hospitalization for further medical management.   [PR]    Clinical Course User Index [PR] Willy Eddy, MD     As part of my medical decision making, I reviewed the following data within the electronic MEDICAL RECORD NUMBER Nursing notes reviewed and incorporated, Labs reviewed, notes from prior ED  visits and [place] Controlled Substance Database   ____________________________________________   FINAL CLINICAL IMPRESSION(S) / ED DIAGNOSES  Final diagnoses:  Altered mental status, unspecified altered mental status type  Acute on chronic respiratory failure with hypoxia and hypercapnia (HCC)  COPD exacerbation (HCC)      NEW MEDICATIONS STARTED DURING THIS VISIT:  New Prescriptions   No medications on file     Note:  This document was prepared using Dragon voice recognition software and may include unintentional dictation errors.    Willy Eddy, MD 04/07/18 [phone]

## 2018-04-07 NOTE — Progress Notes (Signed)
Blood sugar checked on admission and was 68 and Dr. Duanne Limerick was notified and gave order for dextrose IV.  Blood glucose rechecked and is 95 at this time.

## 2018-04-07 NOTE — Consult Note (Signed)
Pharmacy Antibiotic Note  Dylan Kennedy is a 67 y.o. male admitted on 04/07/2018 with pneumonia.  Pharmacy has been consulted for Levofloxacin dosing. Patient received levofloxacin  IV x 1 dose in ED.   Plan: Start Levofloxacin  IV every 24 hours. PCT ordered with AM labs per protocol.   Height:  (167.6 cm) Weight: (!) 320 lb (145.2 kg) IBW/kg (Calculated) : 63.8  Temp (24hrs), Avg:99.2 F (37.3 C), Min:99.2 F (37.3 C), Max:99.2 F (37.3 C)  Recent Labs  Lab 04/07/18 1203  WBC 7.8  CREATININE 0.89    Estimated Creatinine Clearance: 112.8 mL/min (by C-G formula based on SCr of 0.89 mg/dL).    Allergies  Allergen Reactions  . Bee Venom Hives and Itching    Antimicrobials this admission: 5/28 levofloxacin >>   Thank you for allowing pharmacy to be a part of this patient's care.  Gardner Candle, PharmD, BCPS Clinical Pharmacist 04/07/2018 3:40 PM

## 2018-04-07 NOTE — Care Management Note (Addendum)
Case Management Note  Patient Details  Name: Huan Pollok MRN: [id number] Date of Birth: 20-Sep-1951  Subjective/Objective:                  RNCM consult received regarding patient's poor living conditions.  I spoke with his brother in law Corinda Gubler, which advised patient to call 911 because he is in his 10's and cannot manage patient.  Patient's sister Roselyn Reef is "in the bed from a shoulder injury and she too is elder".  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  Patient is able to drive to get food at drive through windows and has groceries delivered to his home by ordering online. Glidwell states that last year he went to [facility] for rehab and during that time he had his home cleaned up, but it is a mess again. He is a retired Development worker, community carrier. His PCP is Dr. Ola Spurr with [facility] and goes twice a year to his appointments. He has never been married and has no children. He is not affiliated with Veteran's administration. Has has fallen twice now per [facility] and they are unable to physically pick patient up which is why patient called 911. RNCM met with patient in ED 24. He is on chronic O2 through Dr. Raul Del through [employer] [phone]. He was talking in short sentences as he was having some problems breathing. RN was at bedside.  He seemed to saying that he is having some sort of problem with home O2.  He has never had home PT. He agrees with rehab at [facility] again if needed. He has a rollator for when he is out and about.   Action/Plan: Home health list provided to patient. Personal Care services also provided.  CSW also involved. PT evaluation would be helpful. RNCM spoke with [employer] rep [phone]- they have been trying to deliver O2 but patient wouldn't answer phone.  "House that he lives in is not livable condition; things everywhere and very bad smell" per [employer] rep/driver.  (This information was freely given to me as I was only  asking about O2 status).  They said that patient would not be home when they had made arrangements to deliver O2 tanks. Patient states his concentrator works- he just wanted new portable tanks.   Expected Discharge Date:                  Expected Discharge Plan:     In-House Referral:     Discharge planning Services  CM Consult  Post Acute Care Choice:    Choice offered to:  Patient, Sibling  DME Arranged:    DME Agency:     HH Arranged:    [place] Agency:     Status of Service:  In process, will continue to follow  If discussed at Long Length of Stay Meetings, dates discussed:    Additional Comments:  Marshell Garfinkel, RN 04/07/2018, 11:33 AM

## 2018-04-07 NOTE — Progress Notes (Signed)
PHARMACIST - PHYSICIAN COMMUNICATION  CONCERNING:  Enoxaparin (Lovenox) for DVT Prophylaxis   RECOMMENDATION: Patient was prescribed enoxaprin  q24 hours for VTE prophylaxis.   Filed Weights   04/07/18 1142  Weight: (!) 320 lb (145.2 kg)    Body mass index is 51.65 kg/m.  Estimated Creatinine Clearance: 112.8 mL/min (by C-G formula based on SCr of 0.89 mg/dL).  Based on [facility] policy patient is candidate for enoxaparin  every 12 hour dosing due to BMI being >40.  DESCRIPTION: Pharmacy has adjusted enoxaparin dose per [employer] policy, approved through P & T committee.  Patient is now receiving enoxaparin  every 12 hours.   Gardner Candle, PharmD, BCPS Clinical Pharmacist 04/07/2018 3:15 PM

## 2018-04-07 NOTE — Progress Notes (Signed)
Name: Dylan Kennedy MRN: [id number] DOB: 06/23/1951     CONSULTATION DATE: 04/07/2018  67 years old gentleman with history of morbid obesity, COPD, hypertension, obstructive sleep apnea on nocturnal CPAP, chronic respiratory failure on home O2 2 L/min nasal cannula, frequent falls and deconditioning.  Patient presented to the emergency room after he was found on the floor due to fall at her residence, in the emergency room he was complaining of left hip pain and had radiological studies which had ruled out acute fractures.  He will he was also found to be in altered mental status with somnolence and was started on BiPAP titrated up to setting of 20/8 and 30% FiO2. All history was obtained from the admitting physician, the patient and EMR. Patient arrived to the intensive care unit somnolent, tolerating BiPAP and no distress.  Initial blood glucose was less than 60.  PAST MEDICAL HISTORY :   has a past medical history of Acute hypercapnic respiratory failure (HCC) (02/2016), CKD (chronic kidney disease), Exogenous obesity, GERD (gastroesophageal reflux disease), Gout, Hyperlipidemia, Hypertension, Morbid obesity (HCC), and Pneumonia (02/2016).  has a past surgical history that includes Tonsillectomy; Shoulder surgery; and Colonoscopy. Prior to Admission medications   Medication Sig Start Date End Date Taking? Authorizing Provider  allopurinol (ZYLOPRIM) 300 MG tablet Take 300 mg by mouth daily.    Yes [provider]  cholecalciferol (VITAMIN D) 1000 units tablet Take 2,000 Units by mouth daily.    Yes [provider]  furosemide (LASIX) 40 MG tablet Take 1 tablet by mouth daily. 03/13/18  Yes [provider]  lisinopril (PRINIVIL,ZESTRIL) 10 MG tablet Take 10 mg by mouth daily.   Yes [provider]  meloxicam (MOBIC) 7.5 MG tablet Take 1 tablet by mouth daily. 03/06/18  Yes [provider]  modafinil (PROVIGIL) 200 MG tablet Take 1 tablet by mouth  daily. 03/17/18  Yes [provider]  pantoprazole (PROTONIX) 40 MG tablet Take 40 mg by mouth daily.    Yes [provider]  azithromycin (ZITHROMAX) 250 MG tablet Take 1 tablet (250 mg total) by mouth daily. Patient not taking: Reported on 04/07/2018 05/10/16   Altamese Dilling, MD  Fluticasone-Salmeterol (ADVAIR DISKUS) 250-50 MCG/DOSE AEPB Inhale 1 puff into the lungs 2 (two) times daily. Patient not taking: Reported on 04/07/2018 05/10/16   Altamese Dilling, MD  ipratropium-albuterol (DUONEB) 0.5-2.5 (3) MG/3ML SOLN Take 3 mLs by nebulization every 6 (six) hours as needed. Patient not taking: Reported on 04/07/2018 05/10/16   Altamese Dilling, MD  predniSONE (STERAPRED UNI-PAK 21 TAB) 10 MG (21) TBPK tablet Take 6 tabs first day, 5 tab on day 2, then 4 on day 3rd, 3 tabs on day 4th , 2 tab on day 5th, and 1 tab on 6th day. Patient not taking: Reported on 04/07/2018 05/10/16   Altamese Dilling, MD  tiotropium (SPIRIVA HANDIHALER) 18 MCG inhalation capsule Place 1 capsule (18 mcg total) into inhaler and inhale every morning. Patient not taking: Reported on 04/07/2018 05/10/16   Altamese Dilling, MD   Allergies  Allergen Reactions  . Bee Venom Hives and Itching    FAMILY HISTORY:  family history includes Bone cancer in his father; Breast cancer in his mother. SOCIAL HISTORY:  reports that he has never smoked. He has never used smokeless tobacco. He reports that he does not drink alcohol or use drugs.  REVIEW OF SYSTEMS:   Unable to obtain due to critical illness   VITAL SIGNS: Temp:  [99.2 F (37.3  C)] 99.2 F (37.3 C) (05/28 1202) Pulse Rate:  [57-83] 57 (05/28 1430) Resp:  [19-25] 19 (05/28 1430) BP: (119-142)/(67-76) 119/67 (05/28 1430) SpO2:  [93 %-99 %] 99 % (05/28 1430) Weight:  [145.2 kg (320 lb)] 145.2 kg (320 lb) (05/28 1142)  Physical Examination:  Somnolent, arousable moving all extremities and following simple commands.  No focal  motor deficits. Tolerating BiPAP settings of 20/8 30% FiO2.  Bilateral equal air entry and no adventitious sounds S1 & S2 are audible with no murmur Benign obese abdomen with feeble peristalsis Extremities are wasted and no edema.  ASSESSMENT / PLAN:  Acute on chronic respiratory failure with baseline home O2 2 L/min, obstructive sleep apnea on nocturnal CPAP and obesity hypoventilation syndrome -Monitor ABG, optimize BiPAP settings and consider intubation if no improvement  Mental status with hypercapnic respiratory failure and hypoglycemia.  No acute intracranial abnormalities and CT head, incidental finding of superior left cerebral convexity possible small subdural hygroma versus chronic subdural hematoma with no mass-effect. -Monitor neuro status.  Acute exacerbation of COPD -Bronchodilators + systemic steroids + inhaled steroids + empiric antibiotics.  Atelectasis and pneumonia.  Bibasilar airspace disease -Rocephin + Zithromax.  Monitor chest x-ray, CBC and FiO2  Hypoglycemia with no history of diabetes mellitus -Glycemic control  Hyperbilirubinemia -Hydration and monitor liver profile  Anemia -Keep Hb more than 7 g/dL  Echo 09/9146 LVEF 65 to 70% no wall motion abnormalities  Full code  DVT & GI prophylaxis.  Continue with supportive care.  Critical care time 50 minutes

## 2018-04-08 ENCOUNTER — Encounter: Payer: Self-pay | Admitting: Emergency Medicine

## 2018-04-08 LAB — PHOSPHORUS: Phosphorus: 2.9 mg/dL (ref 2.5–4.6)

## 2018-04-08 LAB — BASIC METABOLIC PANEL
Anion gap: 10 (ref 5–15)
BUN: 20 mg/dL (ref 6–20)
CHLORIDE: 96 mmol/L — AB (ref 101–111)
CO2: 36 mmol/L — AB (ref 22–32)
CREATININE: 0.93 mg/dL (ref 0.61–1.24)
Calcium: 8.5 mg/dL — ABNORMAL LOW (ref 8.9–10.3)
GFR calc non Af Amer: >60 mL/min (ref >60)
GLUCOSE: 111 mg/dL — AB (ref 65–99)
Potassium: 4 mmol/L (ref 3.5–5.1)
Sodium: 142 mmol/L (ref 135–145)

## 2018-04-08 LAB — BLOOD GAS, ARTERIAL
ACID-BASE EXCESS: 17.3 mmol/L — AB (ref 0.0–2.0)
Bicarbonate: 44.5 mmol/L — ABNORMAL HIGH (ref 20.0–28.0)
Delivery systems: POSITIVE
Expiratory PAP: 8
FIO2: 0.35
INSPIRATORY PAP: 20
LHR: 8 {breaths}/min
O2 Saturation: 91 %
PCO2 ART: 64 mmHg — AB (ref 32.0–48.0)
Patient temperature: 36.8
pH, Arterial: 7.45 (ref 7.350–7.450)
pO2, Arterial: 57 mmHg — ABNORMAL LOW (ref 83.0–108.0)

## 2018-04-08 LAB — CBC WITH DIFFERENTIAL/PLATELET
Basophils Absolute: 0 10*3/uL (ref 0–0.1)
Basophils Relative: 0 %
Eosinophils Absolute: 0 10*3/uL (ref 0–0.7)
Eosinophils Relative: 0 %
HEMATOCRIT: 37.3 % — AB (ref 40.0–52.0)
HEMOGLOBIN: 12.1 g/dL — AB (ref 13.0–18.0)
LYMPHS ABS: 0.3 10*3/uL — AB (ref 1.0–3.6)
LYMPHS PCT: 4 %
MCH: 28.7 pg (ref 26.0–34.0)
MCHC: 32.3 g/dL (ref 32.0–36.0)
MCV: 88.6 fL (ref 80.0–100.0)
MONO ABS: 0.1 10*3/uL — AB (ref 0.2–1.0)
MONOS PCT: 1 %
NEUTROS ABS: 6.2 10*3/uL (ref 1.4–6.5)
NEUTROS PCT: 95 %
Platelets: 178 10*3/uL (ref 150–440)
RBC: 4.21 MIL/uL — ABNORMAL LOW (ref 4.40–5.90)
RDW: 16.9 % — AB (ref 11.5–14.5)
WBC: 6.6 10*3/uL (ref 3.8–10.6)

## 2018-04-08 LAB — GLUCOSE, CAPILLARY
GLUCOSE-CAPILLARY: 101 mg/dL — AB (ref 65–99)
GLUCOSE-CAPILLARY: 118 mg/dL — AB (ref 65–99)
GLUCOSE-CAPILLARY: 48 mg/dL — AB (ref 65–99)
Glucose-Capillary: 68 mg/dL (ref 65–99)

## 2018-04-08 LAB — PROCALCITONIN: Procalcitonin: 0.18 ng/mL

## 2018-04-08 LAB — MAGNESIUM: MAGNESIUM: 1.8 mg/dL (ref 1.7–2.4)

## 2018-04-08 MED ORDER — SODIUM CHLORIDE 0.9 % IV SOLN
500.0000 mg | Freq: Every day | INTRAVENOUS | Status: DC
  Filled 2018-04-08: qty 500

## 2018-04-08 MED ORDER — IPRATROPIUM-ALBUTEROL 0.5-2.5 (3) MG/3ML IN SOLN
3.0000 mL | Freq: Four times a day (QID) | RESPIRATORY_TRACT | Status: DC
  Administered 2018-04-08 – 2018-04-12 (×16): 3 mL via RESPIRATORY_TRACT
  Filled 2018-04-08 (×14): qty 3

## 2018-04-08 MED ORDER — ORAL CARE MOUTH RINSE
15.0000 mL | Freq: Two times a day (BID) | OROMUCOSAL | Status: DC
  Administered 2018-04-09 – 2018-04-10 (×3): 15 mL via OROMUCOSAL

## 2018-04-08 MED ORDER — CHLORHEXIDINE GLUCONATE 0.12 % MT SOLN
15.0000 mL | Freq: Two times a day (BID) | OROMUCOSAL | Status: DC
  Administered 2018-04-08 – 2018-04-12 (×9): 15 mL via OROMUCOSAL
  Filled 2018-04-08 (×8): qty 15

## 2018-04-08 MED ORDER — SODIUM CHLORIDE 0.9 % IV SOLN
1.0000 g | Freq: Every day | INTRAVENOUS | Status: DC
  Administered 2018-04-09 – 2018-04-12 (×4): 1 g via INTRAVENOUS
  Filled 2018-04-08 (×4): qty 1

## 2018-04-08 NOTE — Progress Notes (Signed)
[facility] - [place] at [facility]   PATIENT NAME: Dylan Kennedy    MR#:  [id number]  DATE OF BIRTH:  1950/11/25  SUBJECTIVE:   Patient came in with increasing shortness of breath was placed on BiPAP for shortness of breath. He was found in very poor living condition. He is a poor historian. Now off BiPAP. No family. REVIEW OF SYSTEMS:   Review of Systems  Constitutional: Positive for malaise/fatigue. Negative for chills, fever and weight loss.  HENT: Negative for ear discharge, ear pain and nosebleeds.   Eyes: Negative for blurred vision, pain and discharge.  Respiratory: Positive for shortness of breath. Negative for sputum production, wheezing and stridor.   Cardiovascular: Negative for chest pain, palpitations, orthopnea and PND.  Gastrointestinal: Negative for abdominal pain, diarrhea, nausea and vomiting.  Genitourinary: Negative for frequency and urgency.  Musculoskeletal: Negative for back pain and joint pain.  Neurological: Positive for weakness. Negative for sensory change, speech change and focal weakness.  Psychiatric/Behavioral: Negative for depression and hallucinations. The patient is not nervous/anxious.    Tolerating Diet:yes Tolerating PT: pending  DRUG ALLERGIES:   Allergies  Allergen Reactions  . Bee Venom Hives and Itching    VITALS:  Blood pressure (!) 134/59, pulse 81, temperature 97.9 F (36.6 C), temperature source Oral, resp. rate 15, height  (1.676 m), weight (!) 142.3 kg (313 lb 11.4 oz), SpO2 92 %.  PHYSICAL EXAMINATION:   Physical Exam  GENERAL:  67 y.o.-year-old patient lying in the bed with no acute distress.obese, POOR HYGIENE EYES: Pupils equal, round, reactive to light and accommodation. No scleral icterus. Extraocular muscles intact.  HEENT: Head atraumatic, normocephalic. Oropharynx and nasopharynx clear.  NECK:  Supple, no jugular venous distention. No thyroid enlargement, no tenderness.  LUNGS: Normal  breath sounds bilaterally, no wheezing, rales, rhonchi. No use of accessory muscles of respiration.  CARDIOVASCULAR: S1, S2 normal. No murmurs, rubs, or gallops.  ABDOMEN: Soft, nontender, nondistended. Bowel sounds present. No organomegaly or mass. Abdominal obesity EXTREMITIES edema++    NEUROLOGIC: Cranial nerves II through XII are intact. No focal Motor or sensory deficits b/l.   PSYCHIATRIC:  patient is alert and oriented x 2  SKIN: No obvious rash, lesion, or ulcer.   LABORATORY PANEL:  CBC Recent Labs  Lab 04/08/18 0501  WBC 6.6  HGB 12.1*  HCT 37.3*  PLT 178    Chemistries  Recent Labs  Lab 04/07/18 1203  04/08/18 0501  NA 143  --  142  K 3.7  --  4.0  CL 93*  --  96*  CO2 38*  --  36*  GLUCOSE 92  --  111*  BUN 19  --  20  CREATININE 0.89   < > 0.93  CALCIUM 8.8*  --  8.5*  MG  --   --  1.8  AST 23  --   --   ALT 10*  --   --   ALKPHOS 72  --   --   BILITOT 1.7*  --   --    < > = values in this interval not displayed.   Cardiac Enzymes Recent Labs  Lab 04/07/18 1203  TROPONINI <0.03   RADIOLOGY:  Ct Head Wo Contrast  Result Date: 04/07/2018 CLINICAL DATA:  Initial evaluation for acute altered mental status. EXAM: CT HEAD WITHOUT CONTRAST TECHNIQUE: Contiguous axial images were obtained from the base of the skull through the vertex without intravenous contrast. COMPARISON:  None. FINDINGS: Brain: Examination  mildly limited by patient positioning. Generalized age-related cerebral atrophy with mild chronic small vessel ischemic disease. No acute intracranial hemorrhage. No acute large vessel territory infarct. No mass lesion, midline shift or mass effect. No hydrocephalus. Faint low-density collection overlying the superior left cerebral convexity measuring up to 5 mm in maximal thickness may reflect a small subdural hygroma versus chronic subdural hematoma (series 4, image 28). No associated mass effect. No other extra-axial collections. Vascular: No hyperdense  vessel. Scattered vascular calcifications noted within the carotid siphons. Skull: Scalp soft tissues demonstrate no acute abnormality. Calvarium intact. Sinuses/Orbits: Globes and orbital soft tissues within normal limits. Visualized paranasal sinuses are clear. Small right mastoid effusion noted. Left mastoid air cells clear. Other: None. IMPRESSION: 1. No acute intracranial abnormality. 2. Small 5 mm low-density extra-axial collection overlying the superior left cerebral convexity, either reflecting a small subdural hygroma or possibly chronic subdural hematoma. No associated mass effect. 3. Mild atrophy with chronic small vessel ischemic disease. Electronically Signed   By: Rise Mu M.D.   On: 04/07/2018 15:30   Dg Chest Portable 1 View  Result Date: 04/07/2018 CLINICAL DATA:  Shortness of breath, morbid obesity. History of acute respiratory failure and pneumonia. EXAM: PORTABLE CHEST 1 VIEW COMPARISON:  Portable chest x-ray of May 09, 2016 FINDINGS: The lungs are borderline hypoinflated. Aeration of the lungs has improved since the previous study. There is hazy increased density obscuring the cardiac apex. The cardiac silhouette is mildly enlarged. The pulmonary vascularity is not engorged. The bony thorax exhibits no acute abnormality. IMPRESSION: Borderline hypoinflated. Increased density at the left lung base laterally may be normal for the patient but could reflect atelectasis or pneumonia. No CHF. Electronically Signed   By: David  [place] M.D.   On: 04/07/2018 12:50   Dg Hip Unilat With Pelvis 2-3 Views Left  Result Date: 04/07/2018 CLINICAL DATA:  History of fall.  Pain. EXAM: DG HIP (WITH OR WITHOUT PELVIS) 2-3V LEFT COMPARISON:  No recent. FINDINGS: No acute bony or joint abnormality identified. No evidence of fracture or dislocation. Pelvic calcifications consistent phleboliths. IMPRESSION: No acute abnormality. Electronically Signed   By: Maisie Fus  Register   On: 04/07/2018 12:49    ASSESSMENT AND PLAN:  67 year old with history of hypertension, COPD, morbid obesity came in with altered mental status.  Found on the floor by EMS.  Initially [facility] department called by the patient and they found very poor living conditions.  1. acute on chronic hypercapnia hypoxic respiratory failure secondary to COPD exacerbation -patient has been off BiPAP. Currently on cannula oxygen. -Steroids, inhalers, empiric antibiotic, nebulizer.  2. Hypertension -resume home meds  3. Sleep apnea and obesity hypoventilation syndrome -patient on Provigil  4. Gerd -PPI  5. Case manager for discharge planning. Patient apparently was found in a poor living condition. APS has been contacted.  Case discussed with Care Management/Social Worker. Management plans discussed with the patient, family and they are in agreement.  CODE STATUS: full  DVT Prophylaxis: *lovenox*  TOTAL TIME TAKING CARE OF THIS PATIENT: *30 minutes.  >50% time spent on counselling and coordination of care  POSSIBLE D/C IN **1-2 DAYS, DEPENDING ON CLINICAL CONDITION.  Note: This dictation was prepared with Dragon dictation along with smaller phrase technology. Any transcriptional errors that result from this process are unintentional.  Enedina Finner M.D on 04/08/2018 at 3:43 PM  Between 7am to 6pm - Pager - [phone]  After 6pm go to [email or web address] - password [email or web address]  [employer]  [phone]  CC: Primary care physician; Mick Sell, MDPatient ID: Dylan Kennedy, male   DOB: 30-Jun-1951, 67 y.o.   MRN: [id number]

## 2018-04-08 NOTE — Progress Notes (Signed)
Name: Dylan Kennedy MRN: [id number] DOB: August 03, 1951     CONSULTATION DATE: 04/07/2018  Patient remains on a BiPAP and did not tolerate nasal cannula.  PAST MEDICAL HISTORY :   has a past medical history of Acute hypercapnic respiratory failure (HCC) (02/2016), CKD (chronic kidney disease), Exogenous obesity, GERD (gastroesophageal reflux disease), Gout, Hyperlipidemia, Hypertension, Morbid obesity (HCC), and Pneumonia (02/2016).  has a past surgical history that includes Tonsillectomy; Shoulder surgery; and Colonoscopy. Prior to Admission medications   Medication Sig Start Date End Date Taking? Authorizing Provider  allopurinol (ZYLOPRIM) 300 MG tablet Take 300 mg by mouth daily.    Yes [provider]  cholecalciferol (VITAMIN D) 1000 units tablet Take 2,000 Units by mouth daily.    Yes [provider]  furosemide (LASIX) 40 MG tablet Take 1 tablet by mouth daily. 03/13/18  Yes [provider]  lisinopril (PRINIVIL,ZESTRIL) 10 MG tablet Take 10 mg by mouth daily.   Yes [provider]  meloxicam (MOBIC) 7.5 MG tablet Take 1 tablet by mouth daily. 03/06/18  Yes [provider]  modafinil (PROVIGIL) 200 MG tablet Take 1 tablet by mouth daily. 03/17/18  Yes [provider]  pantoprazole (PROTONIX) 40 MG tablet Take 40 mg by mouth daily.    Yes [provider]  azithromycin (ZITHROMAX) 250 MG tablet Take 1 tablet (250 mg total) by mouth daily. Patient not taking: Reported on 04/07/2018 05/10/16   Altamese Dilling, MD  Fluticasone-Salmeterol (ADVAIR DISKUS) 250-50 MCG/DOSE AEPB Inhale 1 puff into the lungs 2 (two) times daily. Patient not taking: Reported on 04/07/2018 05/10/16   Altamese Dilling, MD  ipratropium-albuterol (DUONEB) 0.5-2.5 (3) MG/3ML SOLN Take 3 mLs by nebulization every 6 (six) hours as needed. Patient not taking: Reported on 04/07/2018 05/10/16   Altamese Dilling, MD  predniSONE (STERAPRED UNI-PAK 21 TAB)  10 MG (21) TBPK tablet Take 6 tabs first day, 5 tab on day 2, then 4 on day 3rd, 3 tabs on day 4th , 2 tab on day 5th, and 1 tab on 6th day. Patient not taking: Reported on 04/07/2018 05/10/16   Altamese Dilling, MD  tiotropium (SPIRIVA HANDIHALER) 18 MCG inhalation capsule Place 1 capsule (18 mcg total) into inhaler and inhale every morning. Patient not taking: Reported on 04/07/2018 05/10/16   Altamese Dilling, MD   Allergies  Allergen Reactions  . Bee Venom Hives and Itching    FAMILY HISTORY:  family history includes Bone cancer in his father; Breast cancer in his mother. SOCIAL HISTORY:  reports that he has never smoked. He has never used smokeless tobacco. He reports that he does not drink alcohol or use drugs.  REVIEW OF SYSTEMS:   Unable to obtain due to critical illness   VITAL SIGNS: Temp:  [97.7 F (36.5 C)-99.2 F (37.3 C)] 98.2 F (36.8 C) (05/29 0200) Pulse Rate:  [53-83] 67 (05/29 0500) Resp:  [11-25] 17 (05/29 0500) BP: (110-174)/(50-144) 113/75 (05/29 0500) SpO2:  [90 %-99 %] 90 % (05/29 0500) FiO2 (%):  [35 %] 35 % (05/29 0739) Weight:  [141.2 kg (311 lb 4.6 oz)-145.2 kg (320 lb)] 142.3 kg (313 lb 11.4 oz) (05/29 0500)  Physical Examination:  Somnolent, arousable, verbally responsive in no distress.  No focal neurological deficits Tolerating BiPAP settings of 20/8 30% FiO2.  Bilateral equal air entry and no adventitious sounds S1 & S2 are audible with no murmur Benign obese abdomen with feeble peristalsis Extremities are wasted and no edema.     ASSESSMENT /  PLAN:  Acute on chronic respiratory failure with baseline home O2 2 L/min, obstructive sleep apnea on nocturnal CPAP and obesity hypoventilation syndrome -Monitor ABG, optimize BiPAP settings and consider intubation if no improvement  Mental status with hypercapnic respiratory failure and hypoglycemia.  No acute intracranial abnormalities and CT head, incidental finding of superior left  cerebral convexity possible small subdural hygroma versus chronic subdural hematoma with no mass-effect. -Monitor neuro status.  Acute exacerbation of COPD -Bronchodilators + systemic steroids + inhaled steroids + empiric antibiotics.  Atelectasis and pneumonia.  Bibasilar airspace disease -Rocephin + Zithromax.    MRSA PCR negative. monitor chest x-ray, CBC and FiO2  Hypoglycemia with no history of diabetes mellitus -Glycemic control  Hyperbilirubinemia -Hydration and monitor liver profile  Anemia -Keep Hb more than 7 g/dL  Echo 16/1096 LVEF 65 to 70% no wall motion abnormalities  Full code  DVT & GI prophylaxis.  Continue with supportive care.  Critical care time 40 minutes

## 2018-04-09 ENCOUNTER — Inpatient Hospital Stay: Payer: Medicare Other

## 2018-04-09 ENCOUNTER — Other Ambulatory Visit: Payer: Self-pay

## 2018-04-09 LAB — CBC WITH DIFFERENTIAL/PLATELET
Basophils Absolute: 0 10*3/uL (ref 0–0.1)
Basophils Relative: 0 %
Eosinophils Absolute: 0 10*3/uL (ref 0–0.7)
Eosinophils Relative: 0 %
HEMATOCRIT: 36.5 % — AB (ref 40.0–52.0)
HEMOGLOBIN: 12.2 g/dL — AB (ref 13.0–18.0)
LYMPHS ABS: 0.2 10*3/uL — AB (ref 1.0–3.6)
LYMPHS PCT: 2 %
MCH: 29.2 pg (ref 26.0–34.0)
MCHC: 33.3 g/dL (ref 32.0–36.0)
MCV: 87.8 fL (ref 80.0–100.0)
MONOS PCT: 2 %
Monocytes Absolute: 0.2 10*3/uL (ref 0.2–1.0)
NEUTROS ABS: 8.9 10*3/uL — AB (ref 1.4–6.5)
NEUTROS PCT: 96 %
Platelets: 188 10*3/uL (ref 150–440)
RBC: 4.16 MIL/uL — AB (ref 4.40–5.90)
RDW: 17.7 % — ABNORMAL HIGH (ref 11.5–14.5)
WBC: 9.4 10*3/uL (ref 3.8–10.6)

## 2018-04-09 LAB — GLUCOSE, CAPILLARY: Glucose-Capillary: 134 mg/dL — ABNORMAL HIGH (ref 65–99)

## 2018-04-09 LAB — PROCALCITONIN: Procalcitonin: 0.15 ng/mL

## 2018-04-09 LAB — CALCIUM, IONIZED: Calcium, Ionized, Serum: 4.7 mg/dL (ref 4.5–5.6)

## 2018-04-09 MED ORDER — VITAMIN D 1000 UNITS PO TABS
2000.0000 [IU] | ORAL_TABLET | Freq: Every day | ORAL | Status: DC
  Administered 2018-04-09 – 2018-04-12 (×4): 2000 [IU] via ORAL
  Filled 2018-04-09 (×4): qty 2

## 2018-04-09 MED ORDER — FUROSEMIDE 40 MG PO TABS
40.0000 mg | ORAL_TABLET | Freq: Every day | ORAL | Status: DC
  Administered 2018-04-09 – 2018-04-12 (×4): 40 mg via ORAL
  Filled 2018-04-09 (×4): qty 1

## 2018-04-09 MED ORDER — AZITHROMYCIN 250 MG PO TABS
250.0000 mg | ORAL_TABLET | Freq: Every day | ORAL | Status: DC
  Administered 2018-04-09 – 2018-04-11 (×2): 250 mg via ORAL
  Filled 2018-04-09 (×3): qty 1

## 2018-04-09 MED ORDER — METHYLPREDNISOLONE SODIUM SUCC 125 MG IJ SOLR
60.0000 mg | Freq: Two times a day (BID) | INTRAMUSCULAR | Status: DC
  Administered 2018-04-09 – 2018-04-10 (×2): 60 mg via INTRAVENOUS
  Filled 2018-04-09 (×2): qty 2

## 2018-04-09 NOTE — Clinical Social Work Note (Signed)
Clinical Social Work Assessment  Patient Details  Name: Dylan Kennedy MRN: [id number] Date of Birth: 05-23-1951  Date of referral:  04/09/18               Reason for consult:  Abuse/Neglect                Permission sought to share information with:    Permission granted to share information::     Name::        Agency::     Relationship::     Contact Information:     Housing/Transportation Living arrangements for the past 2 months:  Single Family Home Source of Information:  Patient Patient Interpreter Needed:  None Criminal Activity/Legal Involvement Pertinent to Current Situation/Hospitalization:  No - Comment as needed Significant Relationships:  None Lives with:  Self Do you feel safe going back to the place where you live?  Yes Need for family participation in patient care:  No (Coment)  Care giving concerns:  Patient resides alone with his dog.   Social Worker assessment / plan:  CSW received consult for self neglect. CSW met with patient who states that he doesn't want anyone coming to his home because it is so dirty and he is worried about [employer] [facility] [employer]. Patient admits to the house being very dirty as he has not felt good for 5 months. He states that his dog poops and urinates on the floors. He states that he has piles of trash in the home. Patient states he is going to return home and feels much better now.   [facility] [employer] [facility] [employer] report made by CSW. Patient had an open case and the caseworker is Kalman Drape: [phone].  Employment status:  Unemployed Forensic scientist:  Medicare PT Recommendations:  Outpatient Therapies Information / Referral to community resources:     Patient/Family's Response to care:  Patient expressed appreciation for CSW assistance.  Patient/Family's Understanding of and Emotional Response to Diagnosis, Current Treatment, and Prognosis:  Patient was worried about the  implications of how he is currently living.   Emotional Assessment Appearance:  Appears stated age Attitude/Demeanor/Rapport:  (pleasant and cooperative) Affect (typically observed):  Calm, Pleasant Orientation:  Oriented to Self, Oriented to Place, Oriented to  Time, Oriented to Situation Alcohol / Substance use:  Not Applicable Psych involvement (Current and /or in the community):  No (Comment)  Discharge Needs  Concerns to be addressed:  Other (Comment Required(neglect) Readmission within the last 30 days:  No Current discharge risk:  None Barriers to Discharge:  Continued Medical Work up   [employer], LCSW 04/09/2018, 4:37 PM

## 2018-04-09 NOTE — Progress Notes (Signed)
[facility] - [place] at [facility]   PATIENT NAME: Dylan Kennedy    MR#:  [id number]  DATE OF BIRTH:  1950-11-26  SUBJECTIVE:   Alert, awake, oriented.  Says he is feeling better.  Will discontinue Foley catheter, get PT evaluation. REVIEW OF SYSTEMS:   Review of Systems  Constitutional: Positive for malaise/fatigue. Negative for chills, fever and weight loss.  HENT: Negative for ear discharge, ear pain and nosebleeds.   Eyes: Negative for blurred vision, pain and discharge.  Respiratory: Positive for shortness of breath. Negative for sputum production, wheezing and stridor.   Cardiovascular: Negative for chest pain, palpitations, orthopnea and PND.  Gastrointestinal: Negative for abdominal pain, diarrhea, nausea and vomiting.  Genitourinary: Negative for frequency and urgency.  Musculoskeletal: Negative for back pain and joint pain.  Neurological: Positive for weakness. Negative for sensory change, speech change and focal weakness.  Psychiatric/Behavioral: Negative for depression and hallucinations. The patient is not nervous/anxious.    Tolerating Diet:yes Tolerating PT: pending  DRUG ALLERGIES:   Allergies  Allergen Reactions  . Bee Venom Hives and Itching    VITALS:  Blood pressure 133/64, pulse 63, temperature 97.8 F (36.6 C), temperature source Oral, resp. rate 20, height  (1.676 m), weight (!) 142.3 kg (313 lb 11.4 oz), SpO2 92 %.  PHYSICAL EXAMINATION:   Physical Exam  GENERAL:  67 y.o.-year-old patient lying in the bed with no acute distress.obese, POOR HYGIENE EYES: Pupils equal, round, reactive to light and accommodation. No scleral icterus. Extraocular muscles intact.  HEENT: Head atraumatic, normocephalic. Oropharynx and nasopharynx clear.  NECK:  Supple, no jugular venous distention. No thyroid enlargement, no tenderness.  LUNGS: Normal breath sounds bilaterally, no wheezing, rales, rhonchi. No use of accessory muscles of  respiration.  CARDIOVASCULAR: S1, S2 normal. No murmurs, rubs, or gallops.  ABDOMEN: Soft, nontender, nondistended. Bowel sounds present. No organomegaly or mass. Abdominal obesity EXTREMITIES edema++    NEUROLOGIC: Cranial nerves II through XII are intact. No focal Motor or sensory deficits b/l.   PSYCHIATRIC:  patient is alert and oriented x 2  SKIN: No obvious rash, lesion, or ulcer.   LABORATORY PANEL:  CBC Recent Labs  Lab 04/09/18 0405  WBC 9.4  HGB 12.2*  HCT 36.5*  PLT 188    Chemistries  Recent Labs  Lab 04/07/18 1203  04/08/18 0501  NA 143  --  142  K 3.7  --  4.0  CL 93*  --  96*  CO2 38*  --  36*  GLUCOSE 92  --  111*  BUN 19  --  20  CREATININE 0.89   < > 0.93  CALCIUM 8.8*  --  8.5*  MG  --   --  1.8  AST 23  --   --   ALT 10*  --   --   ALKPHOS 72  --   --   BILITOT 1.7*  --   --    < > = values in this interval not displayed.   Cardiac Enzymes Recent Labs  Lab 04/07/18 1203  TROPONINI <0.03   RADIOLOGY:  Ct Head Wo Contrast  Result Date: 04/07/2018 CLINICAL DATA:  Initial evaluation for acute altered mental status. EXAM: CT HEAD WITHOUT CONTRAST TECHNIQUE: Contiguous axial images were obtained from the base of the skull through the vertex without intravenous contrast. COMPARISON:  None. FINDINGS: Brain: Examination mildly limited by patient positioning. Generalized age-related cerebral atrophy with mild chronic small vessel ischemic disease. No acute  intracranial hemorrhage. No acute large vessel territory infarct. No mass lesion, midline shift or mass effect. No hydrocephalus. Faint low-density collection overlying the superior left cerebral convexity measuring up to 5 mm in maximal thickness may reflect a small subdural hygroma versus chronic subdural hematoma (series 4, image 28). No associated mass effect. No other extra-axial collections. Vascular: No hyperdense vessel. Scattered vascular calcifications noted within the carotid siphons. Skull:  Scalp soft tissues demonstrate no acute abnormality. Calvarium intact. Sinuses/Orbits: Globes and orbital soft tissues within normal limits. Visualized paranasal sinuses are clear. Small right mastoid effusion noted. Left mastoid air cells clear. Other: None. IMPRESSION: 1. No acute intracranial abnormality. 2. Small 5 mm low-density extra-axial collection overlying the superior left cerebral convexity, either reflecting a small subdural hygroma or possibly chronic subdural hematoma. No associated mass effect. 3. Mild atrophy with chronic small vessel ischemic disease. Electronically Signed   By: Rise Mu M.D.   On: 04/07/2018 15:30   Dg Chest Port 1 View  Result Date: 04/09/2018 CLINICAL DATA:  Pneumonia. EXAM: PORTABLE CHEST 1 VIEW COMPARISON:  Radiograph of Apr 07, 2018.  CT scan of February 23, 2016. FINDINGS: Stable cardiomegaly. Right lung is clear. No pneumothorax is noted. Stable left basilar opacity is noted which may represent prominent epicardial fat pad. However, increased left upper lobe opacity is noted concerning for atelectasis or pneumonia. Bony thorax is unremarkable. IMPRESSION: New left upper lobe opacity is noted concerning for pneumonia or atelectasis. Followup PA and lateral chest X-ray is recommended in 3-4 weeks following trial of antibiotic therapy to ensure resolution and exclude underlying malignancy. Electronically Signed   By: Lupita Raider, M.D.   On: 04/09/2018 08:28   Dg Chest Portable 1 View  Result Date: 04/07/2018 CLINICAL DATA:  Shortness of breath, morbid obesity. History of acute respiratory failure and pneumonia. EXAM: PORTABLE CHEST 1 VIEW COMPARISON:  Portable chest x-ray of May 09, 2016 FINDINGS: The lungs are borderline hypoinflated. Aeration of the lungs has improved since the previous study. There is hazy increased density obscuring the cardiac apex. The cardiac silhouette is mildly enlarged. The pulmonary vascularity is not engorged. The bony thorax  exhibits no acute abnormality. IMPRESSION: Borderline hypoinflated. Increased density at the left lung base laterally may be normal for the patient but could reflect atelectasis or pneumonia. No CHF. Electronically Signed   By: David  [place] M.D.   On: 04/07/2018 12:50   Dg Hip Unilat With Pelvis 2-3 Views Left  Result Date: 04/07/2018 CLINICAL DATA:  History of fall.  Pain. EXAM: DG HIP (WITH OR WITHOUT PELVIS) 2-3V LEFT COMPARISON:  No recent. FINDINGS: No acute bony or joint abnormality identified. No evidence of fracture or dislocation. Pelvic calcifications consistent phleboliths. IMPRESSION: No acute abnormality. Electronically Signed   By: Maisie Fus  Register   On: 04/07/2018 12:49   ASSESSMENT AND PLAN:  67 year old with history of hypertension, COPD, morbid obesity came in with altered mental status.  Found on the floor by EMS.  Initially [facility] department called by the patient and they found very poor living conditions.  1. acute on chronic hypercapnia hypoxic respiratory failure secondary to COPD exacerbation -patient has been off BiPAP. Currently on cannula oxygen. -Steroids, inhalers, empiric antibiotic, nebulizer.,  Wean down the steroids today. Currently much better, he was encephalopathic when he came required BiPAP, ICU admission, he is very alert now. Discontinue Foley catheter, out of bed to chair, physical therapy evaluation.  2. Hypertension -resume home meds  3. Sleep apnea and obesity hypoventilation  syndrome -patient on Provigil,  4. Gerd -PPI #5 PT evaluation today, discontinue Foley, and to position for discharge next 24 to 48 hours. 5. Case manager for discharge planning. Patient apparently was found in a poor living condition. APS has been contacted.  Case discussed with Care Management/Social Worker. Management plans discussed with the patient, family and they are in agreement.  CODE STATUS: full  DVT Prophylaxis: *lovenox*  TOTAL TIME TAKING CARE OF THIS  PATIENT: *30 minutes.  >50% time spent on counselling and coordination of care  POSSIBLE D/C IN **1-2 DAYS, DEPENDING ON CLINICAL CONDITION.  Note: This dictation was prepared with Dragon dictation along with smaller phrase technology. Any transcriptional errors that result from this process are unintentional.  Katha Hamming M.D on 04/09/2018 at 12:17 PM  Between 7am to 6pm - Pager - [phone]  After 6pm go to [email or web address] - password [email or web address]  [facility]  Office  [phone]  CC: Primary care physician; Mick Sell, MDPatient ID: Dylan Kennedy, male   DOB: Apr 15, 1951, 67 y.o.   MRN: [id number]

## 2018-04-09 NOTE — Progress Notes (Signed)
Social Worker from [facility] came to see the patient and asked that she be notified when patient is set for discharge. Social workers name is Saintclair Halsted [phone].

## 2018-04-09 NOTE — Progress Notes (Signed)
Pt appeared to be mouth breathing during ABG. Re-checked sat on pulse ox. & sat was 93% on 4 lpm.

## 2018-04-09 NOTE — Progress Notes (Signed)
IPAP decreased to 16 at pts request

## 2018-04-09 NOTE — Evaluation (Signed)
Physical Therapy Evaluation Patient Details Name: Dylan Kennedy MRN: [id number] DOB: 1950/11/28 Today's Date: 04/09/2018   History of Present Illness  Pt admitted for respiratory failure with complaints of AMS. HIstory includes morbid obesity, COPD, HTN, falls, gout, and GERD. Pt currently A&O x 4.  Clinical Impression  Pt is a pleasant 67 year old male who was admitted for respiratory failure and complaints of AMS. Upon arrival, pt on bedpan and reports he has not ambulated since admitted to hospital. Pt performs bed mobility with mod I, transfers with cga, and ambulation with cga and +2 with RW.+2 needed for safety due to obesity and falls history. No physical assist required for hallway ambulation. No fatigue noted during ambulation, however O2 sats decreased quickly therefore limited mobility efforts. All mobility performed on 4l of O2 which he reports is baseline. Pt demonstrates deficits with endurance/balance/strength. Appears motivated to perform therapy however anxious about having follow up therapy once discharged. Discussed with CSW and CM. Agreed HHPT would be beneficial for multidisciplinary care. Would benefit from skilled PT to address above deficits and promote optimal return to PLOF. Recommend transition to HHPT upon discharge from acute hospitalization.       Follow Up Recommendations Home health PT(if pt agreeable)    Equipment Recommendations  None recommended by PT    Recommendations for Other Services       Precautions / Restrictions Precautions Precautions: Fall Restrictions Weight Bearing Restrictions: No      Mobility  Bed Mobility Overal bed mobility: Modified Independent             General bed mobility comments: uses railing, able to sit with upright posture. All mobility performed while on 4L of O2.  Transfers Overall transfer level: Needs assistance Equipment used: Rolling walker (2 wheeled) Transfers: Sit to/from Stand Sit to Stand: Min  guard         General transfer comment: upright posture noted with BRW. No buckling or LOB noted. +2 for safety due to high falls risk  Ambulation/Gait Ambulation/Gait assistance: Min guard;+2 safety/equipment Ambulation Distance (Feet): 80 Feet Assistive device: Rolling walker (2 wheeled) Gait Pattern/deviations: Step-through pattern     General Gait Details: ambulated in hallway with good speed however needs cues for turns and for line/lead management. 2nd person for safety. No fatigue present, however O2 sats decrease to 83%. Returned to room with seated rest break and sats quickly improve to 90%.  Stairs            Wheelchair Mobility    Modified Rankin (Stroke Patients Only)       Balance Overall balance assessment: Needs assistance;History of Falls Sitting-balance support: Feet supported Sitting balance-Leahy Scale: Good     Standing balance support: Bilateral upper extremity supported Standing balance-Leahy Scale: Fair                               Pertinent Vitals/Pain Pain Assessment: No/denies pain    Home Living Family/patient expects to be discharged to:: Private residence Living Arrangements: Alone   Type of Home: House Home Access: Stairs to enter;Ramped entrance Entrance Stairs-Rails: Right Entrance Stairs-Number of Steps: 3 Home Layout: One level Home Equipment: Walker - 4 wheels;Cane - single point;Toilet riser;Grab bars - toilet      Prior Function Level of Independence: Independent with assistive device(s)         Comments: uses rollater for all mobility and places chairs throughout home secondary to fatigue.  He reports he either sits in chairs or braces against wall when he tired. Reports multiple falls while getting in/out of bed.     Hand Dominance        Extremity/Trunk Assessment   Upper Extremity Assessment Upper Extremity Assessment: Overall WFL for tasks assessed    Lower Extremity Assessment Lower  Extremity Assessment: Generalized weakness(B LE grossly 4/5)       Communication   Communication: No difficulties  Cognition Arousal/Alertness: Awake/alert Behavior During Therapy: WFL for tasks assessed/performed Overall Cognitive Status: Within Functional Limits for tasks assessed                                        General Comments      Exercises Other Exercises Other Exercises: Seated ther-ex performed on B LE including ankle pumps, quad sets, LAQ, and hip abd/add. All ther-ex performed x 10 reps with supervision and cues for technique and to breathe as pt tends to hold breath.   Assessment/Plan    PT Assessment Patient needs continued PT services  PT Problem List Decreased strength;Decreased activity tolerance;Decreased balance;Decreased mobility;Obesity       PT Treatment Interventions Gait training;Therapeutic exercise;Balance training;Stair training;Therapeutic activities    PT Goals (Current goals can be found in the Care Plan section)  Acute Rehab PT Goals Patient Stated Goal: to walk more PT Goal Formulation: With patient Time For Goal Achievement: 04/23/18 Potential to Achieve Goals: Good    Frequency Min 2X/week   Barriers to discharge        Co-evaluation               AM-PAC PT "6 Clicks" Daily Activity  Outcome Measure Difficulty turning over in bed (including adjusting bedclothes, sheets and blankets)?: None Difficulty moving from lying on back to sitting on the side of the bed? : None Difficulty sitting down on and standing up from a chair with arms (e.g., wheelchair, bedside commode, etc,.)?: Unable Help needed moving to and from a bed to chair (including a wheelchair)?: A Little Help needed walking in hospital room?: A Little Help needed climbing 3-5 steps with a railing? : A Lot 6 Click Score: 17    End of Session Equipment Utilized During Treatment: Gait belt;Oxygen Activity Tolerance: Patient tolerated treatment  well Patient left: in chair;with chair alarm set Nurse Communication: Mobility status PT Visit Diagnosis: Unsteadiness on feet (R26.81);Muscle weakness (generalized) (M62.81);Difficulty in walking, not elsewhere classified (R26.2)    Time: 1610-9604 PT Time Calculation (min) (ACUTE ONLY): 37 min   Charges:   PT Evaluation $PT Eval Low Complexity: 1 Low PT Treatments $Therapeutic Exercise: 8-22 mins   PT G Codes:        Elizabeth Palau, PT, DPT [phone]   Rylie Limburg 04/09/2018, 3:28 PM

## 2018-04-10 LAB — BLOOD GAS, ARTERIAL
ACID-BASE EXCESS: 14.4 mmol/L — AB (ref 0.0–2.0)
BICARBONATE: 40.5 mmol/L — AB (ref 20.0–28.0)
FIO2: 0.36
O2 Saturation: 86.5 %
PCO2 ART: 57 mmHg — AB (ref 32.0–48.0)
PH ART: 7.46 — AB (ref 7.350–7.450)
PO2 ART: 49 mmHg — AB (ref 83.0–108.0)
Patient temperature: 37

## 2018-04-10 LAB — GLUCOSE, CAPILLARY: GLUCOSE-CAPILLARY: 87 mg/dL (ref 65–99)

## 2018-04-10 MED ORDER — METHYLPREDNISOLONE SODIUM SUCC 40 MG IJ SOLR
40.0000 mg | Freq: Two times a day (BID) | INTRAMUSCULAR | Status: DC
  Administered 2018-04-11 – 2018-04-12 (×3): 40 mg via INTRAVENOUS
  Filled 2018-04-10 (×4): qty 1

## 2018-04-10 MED ORDER — SODIUM CHLORIDE 0.9 % IV BOLUS: 500.0000 mL | Freq: Once | INTRAVENOUS | Status: DC

## 2018-04-10 NOTE — Progress Notes (Signed)
[facility] - [place] at [facility]   PATIENT NAME: Dylan Kennedy    MR#:  [id number]  DATE OF BIRTH:  01/08/1951  SUBJECTIVE:   Alert, awake, oriented, says he is feeling better, advance the diet to regular diet today.  Shortness of breath improved. REVIEW OF SYSTEMS:   Review of Systems  Constitutional: Positive for malaise/fatigue. Negative for chills, fever and weight loss.  HENT: Negative for ear discharge, ear pain and nosebleeds.   Eyes: Negative for blurred vision, pain and discharge.  Respiratory: Positive for shortness of breath. Negative for sputum production, wheezing and stridor.   Cardiovascular: Negative for chest pain, palpitations, orthopnea and PND.  Gastrointestinal: Negative for abdominal pain, diarrhea, nausea and vomiting.  Genitourinary: Negative for frequency and urgency.  Musculoskeletal: Negative for back pain and joint pain.  Neurological: Positive for weakness. Negative for sensory change, speech change and focal weakness.  Psychiatric/Behavioral: Negative for depression and hallucinations. The patient is not nervous/anxious.    Tolerating Diet:yes Tolerating PT: pending  DRUG ALLERGIES:   Allergies  Allergen Reactions  . Bee Venom Hives and Itching    VITALS:  Blood pressure 133/82, pulse (!) 55, temperature (!) 97.3 F (36.3 C), temperature source Oral, resp. rate 20, height  (1.676 m), weight (!) 142.3 kg (313 lb 11.4 oz), SpO2 93 %.  PHYSICAL EXAMINATION:   Physical Exam  GENERAL:  67 y.o.-year-old patient lying in the bed with no acute distress.obese, POOR HYGIENE EYES: Pupils equal, round, reactive to light and accommodation. No scleral icterus. Extraocular muscles intact.  HEENT: Head atraumatic, normocephalic. Oropharynx and nasopharynx clear.  NECK:  Supple, no jugular venous distention. No thyroid enlargement, no tenderness.  LUNGS: Normal breath sounds bilaterally, no wheezing, rales, rhonchi. No use  of accessory muscles of respiration.  CARDIOVASCULAR: S1, S2 normal. No murmurs, rubs, or gallops.  ABDOMEN: Soft, nontender, nondistended. Bowel sounds present. No organomegaly or mass. Abdominal obesity EXTREMITIES edema++    NEUROLOGIC: Cranial nerves II through XII are intact. No focal Motor or sensory deficits b/l.   PSYCHIATRIC:  patient is alert and oriented x 2  SKIN: No obvious rash, lesion, or ulcer.   LABORATORY PANEL:  CBC Recent Labs  Lab 04/09/18 0405  WBC 9.4  HGB 12.2*  HCT 36.5*  PLT 188    Chemistries  Recent Labs  Lab 04/07/18 1203  04/08/18 0501  NA 143  --  142  K 3.7  --  4.0  CL 93*  --  96*  CO2 38*  --  36*  GLUCOSE 92  --  111*  BUN 19  --  20  CREATININE 0.89   < > 0.93  CALCIUM 8.8*  --  8.5*  MG  --   --  1.8  AST 23  --   --   ALT 10*  --   --   ALKPHOS 72  --   --   BILITOT 1.7*  --   --    < > = values in this interval not displayed.   Cardiac Enzymes Recent Labs  Lab 04/07/18 1203  TROPONINI <0.03   RADIOLOGY:  Dg Chest Port 1 View  Result Date: 04/09/2018 CLINICAL DATA:  Pneumonia. EXAM: PORTABLE CHEST 1 VIEW COMPARISON:  Radiograph of Apr 07, 2018.  CT scan of February 23, 2016. FINDINGS: Stable cardiomegaly. Right lung is clear. No pneumothorax is noted. Stable left basilar opacity is noted which may represent prominent epicardial fat pad. However, increased left upper  lobe opacity is noted concerning for atelectasis or pneumonia. Bony thorax is unremarkable. IMPRESSION: New left upper lobe opacity is noted concerning for pneumonia or atelectasis. Followup PA and lateral chest X-ray is recommended in 3-4 weeks following trial of antibiotic therapy to ensure resolution and exclude underlying malignancy. Electronically Signed   By: Lupita Raider, M.D.   On: 04/09/2018 08:28   ASSESSMENT AND PLAN:  67 year old with history of hypertension, COPD, morbid obesity came in with altered mental status.  Found on the floor by EMS.  Initially  [facility] department called by the patient and they found very poor living conditions.  1. acute on chronic hypercapnia hypoxic respiratory failure secondary to COPD exacerbation -patient has been off BiPAP. Currently on cannula oxygen. -Steroids, inhalers, empiric antibiotic, nebulizer.,  Wean down the steroids today. Currently much better, he was encephalopathic when he came required BiPAP, ICU admission, he is very alert now. D discontinue Foley catheter yesterday, physical therapy recommended home health physical therapy 2. Hypertension -resume home meds, resume heart healthy diet today.  3. Sleep apnea and obesity hypoventilation syndrome -patient on Provigil,  4. Gerd -PPI poor living condition, Child psychotherapist contacted APS, according to Child psychotherapist note patient house is filled with Dog poop, dog urinal. 5. Case manager for discharge planning. Patient apparently was found in a poor living condition. APS has been contacted. Wean down the steroids today, likely discharge home tomorrow with home health, steroids, antibiotics, bronchodilators.   Case discussed with Care Management/Social Worker. Management plans discussed with the patient, family and they are in agreement.  CODE STATUS: full  DVT Prophylaxis: *lovenox*  TOTAL TIME TAKING CARE OF THIS PATIENT: *30 minutes.  >50% time spent on counselling and coordination of care  POSSIBLE D/C IN **1-2 DAYS, DEPENDING ON CLINICAL CONDITION.  Note: This dictation was prepared with Dragon dictation along with smaller phrase technology. Any transcriptional errors that result from this process are unintentional.  Katha Hamming M.D on 04/10/2018 at 11:52 AM  Between 7am to 6pm - Pager - [phone]  After 6pm go to [email or web address] - password [email or web address]  [facility]  Office  [phone]  CC: Primary care physician; Mick Sell, MDPatient ID: Dylan Kennedy, male   DOB: 10-13-1951, 67 y.o.   MRN:  [id number]

## 2018-04-10 NOTE — Care Management Important Message (Signed)
Copy of signed IM left in patient's room.    

## 2018-04-10 NOTE — Progress Notes (Signed)
Physical Therapy Treatment Patient Details Name: Dylan Kennedy MRN: [id number] DOB: 10/15/1951 Today's Date: 04/10/2018    History of Present Illness Pt admitted for respiratory failure with complaints of AMS. HIstory includes morbid obesity, COPD, HTN, falls, gout, and GERD. Pt currently A&O x 4.    PT Comments    Pt is making good progress towards goals with improved ambulation distance noted. O2 sats decrease with exertion, however pt asymptomatic. Cues for pursed lip breathing with increased sats. All mobility performed on 4L of O2. +2 needed for chair follow, however no seated rest breaks needed. Will continue to progress as pt is not quite at baseline level.    Follow Up Recommendations  Home health PT     Equipment Recommendations  None recommended by PT    Recommendations for Other Services       Precautions / Restrictions Precautions Precautions: Fall Restrictions Weight Bearing Restrictions: No    Mobility  Bed Mobility Overal bed mobility: Modified Independent             General bed mobility comments: uses railing, able to sit with upright posture. All mobility performed while on 4L of O2.  Transfers Overall transfer level: Needs assistance Equipment used: Rolling walker (2 wheeled) Transfers: Sit to/from Stand Sit to Stand: Min guard         General transfer comment: only needed +1 assist this date with use of BRW. No buckling noted  Ambulation/Gait Ambulation/Gait assistance: Min guard;+2 safety/equipment Ambulation Distance (Feet): 170 Feet Assistive device: Rolling walker (2 wheeled) Gait Pattern/deviations: Step-through pattern     General Gait Details: ambulated in hallway on 4L of O2 with +2 for chair follow. Recipocal gait pattern with no fatigue present. O2 at 85% on 4L of O2 at end of ambulation quickly rising to 90% on 4L of O2 with cues for pursed lip breathing. Improved speed noted this session and no seated rest breaks  needed.   Stairs             Wheelchair Mobility    Modified Rankin (Stroke Patients Only)       Balance                                            Cognition Arousal/Alertness: Awake/alert Behavior During Therapy: WFL for tasks assessed/performed Overall Cognitive Status: Within Functional Limits for tasks assessed                                        Exercises Other Exercises Other Exercises: ther-ex deferred as pt requesting bath    General Comments        Pertinent Vitals/Pain Pain Assessment: No/denies pain    Home Living                      Prior Function            PT Goals (current goals can now be found in the care plan section) Acute Rehab PT Goals Patient Stated Goal: to walk more PT Goal Formulation: With patient Time For Goal Achievement: 04/23/18 Potential to Achieve Goals: Good Progress towards PT goals: Progressing toward goals    Frequency    Min 2X/week      PT Plan Current plan remains appropriate  Co-evaluation              AM-PAC PT "6 Clicks" Daily Activity  Outcome Measure  Difficulty turning over in bed (including adjusting bedclothes, sheets and blankets)?: None Difficulty moving from lying on back to sitting on the side of the bed? : None Difficulty sitting down on and standing up from a chair with arms (e.g., wheelchair, bedside commode, etc,.)?: Unable Help needed moving to and from a bed to chair (including a wheelchair)?: A Little Help needed walking in hospital room?: A Little Help needed climbing 3-5 steps with a railing? : A Lot 6 Click Score: 17    End of Session Equipment Utilized During Treatment: Gait belt;Oxygen Activity Tolerance: Patient tolerated treatment well Patient left: in chair;with chair alarm set Nurse Communication: Mobility status PT Visit Diagnosis: Unsteadiness on feet (R26.81);Muscle weakness (generalized) (M62.81);Difficulty in  walking, not elsewhere classified (R26.2)     Time: 1610-9604 PT Time Calculation (min) (ACUTE ONLY): 23 min  Charges:  $Gait Training: 23-37 mins                    G Codes:       Elizabeth Palau, PT, DPT [phone]    Thamar Holik 04/10/2018, 4:24 PM

## 2018-04-10 NOTE — Progress Notes (Signed)
Pt still not ready to go on bipap , Rn made aware

## 2018-04-10 NOTE — Care Management (Signed)
PatientsuffersfromCOPDandhastroublebreathingatnightwhenheadiselevatedlessthan45degrees.Bedwedgesdonotprovideenoughelevationtoresolvebreathingissues. Shortness of breathcausepatienttorequire immediate changes inbodypositionwhichcannotbeachieved withanormalbed

## 2018-04-10 NOTE — Care Management (Signed)
Full assessment completed by [facility] on 04/07/18.    PT has assessed patient and recommends home health PT.  Patient is agreeable to services and states he does not have a preference of agency.  Corene Cornea with [facility] met with patient and RNCM at bedside.  Patient states that initially he would like to meet [facility] at his sisters home to receive services.  Patient states "my house is to much of a mess right now, but I have to have it cleaned up in a month before social services comes to see me"  Patient states that he has a RW, bedside commode, chronic O2, and CPAP.   Patient requests Hospital bed. Hospital narrative and order entered.  Patient is aware that he will have his old bed removed prior to new bed being delivered. Patient to notify [facility] when he is ready for it to be delivered.    RNCM pursuing Triology.  MD to enter documentation.  Corene Cornea from [facility] to [facility] completed order form on chart.  MD notified to sign.  At discharge patient will need RN, PT, SW

## 2018-04-11 LAB — GLUCOSE, CAPILLARY: GLUCOSE-CAPILLARY: 89 mg/dL (ref 65–99)

## 2018-04-11 NOTE — Progress Notes (Signed)
Patient continues to exhibit signs of hypercarbia associated with chronic respiratory failure secondary to severe COPD.  Patient requires the use of NIV both nightly and daytime to help with exacerbation..  The use of an IV will treat patient's high PCO2 levels and can reduce risk of exacerbations and future hospitalizations when used at night and during daytime.  Patient will need this drawn settings in conjunction with her current his current regimen, BiPAP is not an option due to his functional limitations with severity of patient's condition.  Failure to have an IV available to use over 24-hour.  Could lead to death. Diagnosis is severe COPD, chronic respiratory failure, patient to be qualified for trilogy.

## 2018-04-11 NOTE — Progress Notes (Signed)
[facility] [place] [facility]   PATIENT NAME: Dylan BottomsJames Allcock    MR#:  [id number]  DATE OF BIRTH:  07/11/1951  SUBJECTIVE:   Slightly confused today than yesterday, patient says that he is seeing for complaints today than yesterday like TV, sharp disposal, REVIEW OF SYSTEMS:   Review of Systems  Constitutional: Positive for malaise/fatigue. Negative for chills, fever and weight loss.  HENT: Negative for ear discharge, ear pain and nosebleeds.   Eyes: Negative for blurred vision, pain and discharge.  Respiratory: Positive for shortness of breath. Negative for sputum production, wheezing and stridor.   Cardiovascular: Negative for chest pain, palpitations, orthopnea and PND.  Gastrointestinal: Negative for abdominal pain, diarrhea, nausea and vomiting.  Genitourinary: Negative for frequency and urgency.  Musculoskeletal: Negative for back pain and joint pain.  Neurological: Positive for weakness. Negative for sensory change, speech change and focal weakness.  Psychiatric/Behavioral: Negative for depression and hallucinations. The patient is not nervous/anxious.    Tolerating Diet:yes Tolerating PT: pending  DRUG ALLERGIES:   Allergies  Allergen Reactions  . Bee Venom Hives and Itching    VITALS:  Blood pressure 131/78, pulse 66, temperature 98 F (36.7 C), temperature source Oral, resp. rate (!) 22, height 5\' 6"  (1.676 m), weight (!) 143.7 kg (316 lb 12.8 oz), SpO2 91 %.  PHYSICAL EXAMINATION:   Physical Exam  GENERAL:  67 y.o.-year-old patient lying in the bed with no acute distress.obese, POOR HYGIENE EYES: Pupils equal, round, reactive to light and accommodation. No scleral icterus. Extraocular muscles intact.  HEENT: Head atraumatic, normocephalic. Oropharynx and nasopharynx clear.  NECK:  Supple, no jugular venous distention. No thyroid enlargement, no tenderness.  LUNGS: Normal breath sounds bilaterally, no wheezing, rales, rhonchi. No  use of accessory muscles of respiration.  CARDIOVASCULAR: S1, S2 normal. No murmurs, rubs, or gallops.  ABDOMEN: Soft, nontender, nondistended. Bowel sounds present. No organomegaly or mass. Abdominal obesity EXTREMITIES edema++    NEUROLOGIC: Cranial nerves II through XII are intact. No focal Motor or sensory deficits b/l.   PSYCHIATRIC:  patient is alert and oriented x 2  SKIN: No obvious rash, lesion, or ulcer.   LABORATORY PANEL:  CBC Recent Labs  Lab 04/09/18 0405  WBC 9.4  HGB 12.2*  HCT 36.5*  PLT 188    Chemistries  Recent Labs  Lab 04/07/18 1203  04/08/18 0501  NA 143  --  142  K 3.7  --  4.0  CL 93*  --  96*  CO2 38*  --  36*  GLUCOSE 92  --  111*  BUN 19  --  20  CREATININE 0.89   < > 0.93  CALCIUM 8.8*  --  8.5*  MG  --   --  1.8  AST 23  --   --   ALT 10*  --   --   ALKPHOS 72  --   --   BILITOT 1.7*  --   --    < > = values in this interval not displayed.   Cardiac Enzymes Recent Labs  Lab 04/07/18 1203  TROPONINI <0.03   RADIOLOGY:  No results found. ASSESSMENT AND PLAN:  67 year old with history of hypertension, COPD, morbid obesity came in with altered mental status.  Found on the floor by EMS.  [facility] and they found very poor living conditions.  1. acute on chronic hypercapnia hypoxic respiratory failure secondary to COPD exacerbation -patient has been off BiPAP.  Currently on cannula oxygen. -Steroids, inhalers, empiric antibiotic, nebulizer.,   .  2. Hypertension -resume home meds, resume heart healthy diet today.  3. Sleep apnea and obesity hypoventilation syndrome -patient on Provigil,, more confused today, monitor closely, patient needs BiPAP at night.  We are trying to see if patient can qualify  for trilogy.  Documentation  4. Genella Rife -PPI poor living condition, Child psychotherapist contacted APS, according to Child psychotherapist note patient house is filled with Dog poop, dog urinal.  Everywhere 5.  Case manager for discharge planning. Patient apparently was found in a poor living condition. APS has been contacted.  To discharge today because he is  confused than usual.  No need for blood gas is able to answer questions appropriately but monitor closely for respiratory status decompensation. Case discussed with Care Management/Social Worker. Management plans discussed with the patient, family and they are in agreement.  CODE STATUS: full  DVT Prophylaxis: *lovenox*  TOTAL TIME TAKING CARE OF THIS PATIENT: *30 minutes.  >50% time spent on counselling and coordination of care  POSSIBLE D/C IN **1-2 DAYS, DEPENDING ON CLINICAL CONDITION.  Note: This dictation was prepared with Dragon dictation along with smaller phrase technology. Any transcriptional errors that result from this process are unintentional.  Katha Hamming M.D on 04/11/2018 at 12:33 PM  Between 7am to 6pm - Pager - [phone]  After 6pm go to [email or web address] - password [email or web address]  [facility]  Office  [phone]  CC: Primary care physician; Mick Sell, MDPatient ID: Dylan Kennedy, male   DOB: 1951/01/03, 67 y.o.   MRN: [id number]

## 2018-04-11 NOTE — Progress Notes (Signed)
Patient taken off of biapap, placed on, and neb treatment administered. Off bipap for the day.

## 2018-04-12 LAB — GLUCOSE, CAPILLARY: Glucose-Capillary: 126 mg/dL — ABNORMAL HIGH (ref 65–99)

## 2018-04-12 MED ORDER — PREDNISONE 10 MG (21) PO TBPK: ORAL_TABLET | ORAL | 0 refills | Status: AC

## 2018-04-12 MED ORDER — AMOXICILLIN-POT CLAVULANATE 875-125 MG PO TABS: 1.0000 | ORAL_TABLET | Freq: Two times a day (BID) | ORAL | 0 refills | Status: AC

## 2018-04-12 NOTE — Progress Notes (Signed)
Resumed oxygen at 2l  

## 2018-04-12 NOTE — Clinical Social Work Note (Signed)
The CSW has left a HIPPA compliant voicemail for [name] [facility] to alert her of the discharge to home for this patient as he has an open APS case.  Argentina PonderKaren Martha Matia Zelada, MSW, Theresia MajorsLCSWA [phone]

## 2018-04-12 NOTE — Discharge Summary (Signed)
Dylan BottomsJames Kennedy, is a 67 y.o. male  DOB 08/05/1951  MRN [id number].  Admission date:  04/07/2018  Admitting Physician  Dylan HammingSnehalatha Zeya Balles, MD  Discharge Date:  04/12/2018   Primary MD  Dylan SellFitzgerald, [name], MD  Recommendations for primary care physician for things to follow:  Discharge home, follow-up with PCP in 1 week   Admission Diagnosis  Fall [W19.XXXA] COPD exacerbation (HCC) [J44.1] Altered mental status, unspecified altered mental status type [R41.82] Acute on chronic respiratory failure with hypoxia and hypercapnia (HCC) [W29.56[J96.21, J96.22]   Discharge Diagnosis  Fall [W19.XXXA] COPD exacerbation (HCC) [J44.1] Altered mental status, unspecified altered mental status type [R41.82] Acute on chronic respiratory failure with hypoxia and hypercapnia (HCC) [J96.21, J96.22]    Active Problems:   Hypercapnic respiratory failure (HCC)      Past Medical History:  Diagnosis Date  . Acute hypercapnic respiratory failure (HCC) 02/2016  . CKD (chronic kidney disease)   . Exogenous obesity   . GERD (gastroesophageal reflux disease)   . Gout   . Hyperlipidemia   . Hypertension   . Morbid obesity (HCC)   . Pneumonia 02/2016    Past Surgical History:  Procedure Laterality Date  . COLONOSCOPY    . SHOULDER SURGERY     right  . 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  foundOn the floor  by EMS.     Hospital Course  Acute on chronic hypercapnic respiratory failure secondary to CO2 retention, acute exacerbation admitted to stepdown unit for BiPAP support.  Patient remained on BiPAP and was in stepdown is 24 hours.  He was very somnolent when he came.  After the BiPAP support patient became more alert, awake, transferred out of ICU, continued on his oxygen.    #2 acute exacerbation of COPD: Patient received bronchodilators, systemic steroids, inhaled steroids, empiric antibiotics, he is feeling better today discharge home today with tapering course of steroids, antibiotics, we can continue his home dose bronchodilators. 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 4.  Atelectasis and pneumonia, bibasilar airspace disease.  Patient received empiric Rocephin, Zithromax. 5.  Deconditioning physical therapy recommended home health physical therapy 6 .poor living condition: Consulted Child psychotherapistsocial worker who consulted social services [facility].  [facility] report made by CSW. Patient had an open case and the caseworker is Dylan Kennedy: [phone].  #7 .-stage COPD: [facility], trying to see if he can trilogy. Patient continues to exhibit signs of hypercarbia associated with chronic respiratory failure secondary to severe COPD.  Patient requires the use of NIV both nightly and daytime to help with exacerbation..  The use of an IV will treat patient's high PCO2 levels and can reduce risk of exacerbations and future hospitalizations when used at night and during daytime.  Patient will need this drawn settings in conjunction with her current his current regimen, BiPAP is not an option due to his functional limitations with severity of patient's condition.  Failure to have an IV available  to use over 24-hour.  Could lead to death. Diagnosis is severe COPD, chronic respiratory failure, patient to be qualified for  trilogy.     Discharge Condition:stable   Follow UP      Discharge Instructions  and  Discharge Medications    Allergies as of 04/12/2018      Reactions   Bee Venom Hives, Itching      Medication List    STOP taking these medications   azithromycin 250 MG tablet Commonly known as:  ZITHROMAX     TAKE these medications   allopurinol 300 MG tablet Commonly known as:  ZYLOPRIM Take 300 mg by mouth daily.   amoxicillin-clavulanate 875-125 MG tablet Commonly known as:  AUGMENTIN Take 1 tablet by mouth 2 (two) times daily for 14 days.   cholecalciferol 1000 units tablet Commonly known as:  VITAMIN D Take 2,000 Units by mouth daily.   Fluticasone-Salmeterol 250-50 MCG/DOSE Aepb Commonly known as:  ADVAIR DISKUS Inhale 1 puff into the lungs 2 (two) times daily.   furosemide 40 MG tablet Commonly known as:  LASIX Take 1 tablet by mouth daily.   ipratropium-albuterol 0.5-2.5 (3) MG/3ML Soln Commonly known as:  DUONEB Take 3 mLs by nebulization every 6 (six) hours as needed.   lisinopril 10 MG tablet Commonly known as:  PRINIVIL,ZESTRIL Take 10 mg by mouth daily.   meloxicam 7.5 MG tablet Commonly known as:  MOBIC Take 1 tablet by mouth daily.   modafinil 200 MG tablet Commonly known as:  PROVIGIL Take 1 tablet by mouth daily.   pantoprazole 40 MG tablet Commonly known as:  PROTONIX Take 40 mg by mouth daily.   predniSONE 10 MG (21) Tbpk tablet Commonly known as:  STERAPRED UNI-PAK 21 TAB taper by 10 mg Kennedy.o. daily What changed:  additional instructions   tiotropium 18 MCG inhalation capsule Commonly known as:  SPIRIVA HANDIHALER Place 1 capsule (18 mcg total) into inhaler and inhale every morning.            Durable Medical Equipment  (From admission, onward)        Start     Ordered   04/10/18 1603  For home use only DME Hospital bed  Once    Question Answer Comment  Patient has (list medical condition): COPD   The above medical  condition requires: Patient requires the ability to reposition immediately   Head must be elevated greater than: 45 degrees   Bed type Semi-electric      04/10/18 1603        Diet and Activity recommendation: See Discharge Instructions above   Consults obtained physical therapy, social worker, intensivist   Major procedures and Radiology Reports - PLEASE review detailed and final reports for all details, in brief -     Ct Head Wo Contrast  Result Date: 04/07/2018 CLINICAL DATA:  Initial evaluation for acute altered mental status. EXAM: CT HEAD WITHOUT CONTRAST TECHNIQUE: Contiguous axial images were obtained from the base of the skull through the vertex without intravenous contrast. COMPARISON:  None. FINDINGS: Brain: Examination mildly limited by patient positioning. Generalized age-related cerebral atrophy with mild chronic small vessel ischemic disease. No acute intracranial hemorrhage. No acute large vessel territory infarct. No mass lesion, midline shift or mass effect. No hydrocephalus. Faint low-density collection overlying the superior left cerebral convexity measuring up to 5 mm in maximal thickness may reflect a small subdural hygroma versus chronic subdural hematoma (series 4, image 28). No associated mass effect. No other extra-axial  collections. Vascular: No hyperdense vessel. Scattered vascular calcifications noted within the carotid siphons. Skull: Scalp soft tissues demonstrate no acute abnormality. Calvarium intact. Sinuses/Orbits: Globes and orbital soft tissues within normal limits. Visualized paranasal sinuses are clear. Small right mastoid effusion noted. Left mastoid air cells clear. Other: None. IMPRESSION: 1. No acute intracranial abnormality. 2. Small 5 mm low-density extra-axial collection overlying the superior left cerebral convexity, either reflecting a small subdural hygroma or possibly chronic subdural hematoma. No associated mass effect. 3. Mild atrophy with  chronic small vessel ischemic disease. Electronically Signed   By: Rise Mu M.D.   On: 04/07/2018 15:30   Dg Chest Port 1 View  Result Date: 04/09/2018 CLINICAL DATA:  Pneumonia. EXAM: PORTABLE CHEST 1 VIEW COMPARISON:  Radiograph of Apr 07, 2018.  CT scan of February 23, 2016. FINDINGS: Stable cardiomegaly. Right lung is clear. No pneumothorax is noted. Stable left basilar opacity is noted which may represent prominent epicardial fat pad. However, increased left upper lobe opacity is noted concerning for atelectasis or pneumonia. Bony thorax is unremarkable. IMPRESSION: New left upper lobe opacity is noted concerning for pneumonia or atelectasis. Followup PA and lateral chest X-ray is recommended in 3-4 weeks following trial of antibiotic therapy to ensure resolution and exclude underlying malignancy. Electronically Signed   By: Lupita Raider, M.D.   On: 04/09/2018 08:28   Dg Chest Portable 1 View  Result Date: 04/07/2018 CLINICAL DATA:  Shortness of breath, morbid obesity. History of acute respiratory failure and pneumonia. EXAM: PORTABLE CHEST 1 VIEW COMPARISON:  Portable chest x-ray of May 09, 2016 FINDINGS: The lungs are borderline hypoinflated. Aeration of the lungs has improved since the previous study. There is hazy increased density obscuring the cardiac apex. The cardiac silhouette is mildly enlarged. The pulmonary vascularity is not engorged. The bony thorax exhibits no acute abnormality. IMPRESSION: Borderline hypoinflated. Increased density at the left lung base laterally may be normal for the patient but could reflect atelectasis or pneumonia. No CHF. Electronically Signed   By: David  [place] M.D.   On: 04/07/2018 12:50   Dg Hip Unilat With Pelvis 2-3 Views Left  Result Date: 04/07/2018 CLINICAL DATA:  History of fall.  Pain. EXAM: DG HIP (WITH OR WITHOUT PELVIS) 2-3V LEFT COMPARISON:  No recent. FINDINGS: No acute bony or joint abnormality identified. No evidence of fracture or  dislocation. Pelvic calcifications consistent phleboliths. IMPRESSION: No acute abnormality. Electronically Signed   By: Maisie Fus  Register   On: 04/07/2018 12:49    Micro Results     Recent Results (from the past 240 hour(s))  MRSA PCR Screening     Status: None   Collection Time: 04/07/18  4:24 PM  Result Value Ref Range Status   MRSA by PCR NEGATIVE NEGATIVE Final    Comment:        The GeneXpert MRSA Assay (FDA approved for NASAL specimens only), is one component of a comprehensive MRSA colonization surveillance program. It is not intended to diagnose MRSA infection nor to guide or monitor treatment for MRSA infections. Performed at [facility], [address] [place], [place] [address]        Today   Subjective:   Izaah Westman today has no headache,no chest abdominal pain,no new weakness tingling or numbness, feels much better wants to go home today.   Objective:   Blood pressure (!) 145/75, pulse 60, temperature 97.6 F (36.4 C), temperature source Oral, resp. rate 20, height 5\' 6"  (1.676 m), weight (!) 142.6  kg (314 lb 6.4 oz), SpO2 (!) 82 %.   Intake/Output Summary (Last 24 hours) at 04/12/2018 1003 Last data filed at 04/12/2018 0457 Gross per 24 hour  Intake 920 ml  Output 1125 ml  Net -205 ml    Exam Awake Alert, Oriented x 3, No new F.N deficits, Normal affect [place].AT,PERRAL Supple Neck,No JVD, No cervical lymphadenopathy appriciated.  Symmetrical Chest wall movement, Good air movement bilaterally, CTAB RRR,No Gallops,Rubs or new Murmurs, No Parasternal Heave +ve B.Sounds, Abd Soft, Non tender, No organomegaly appriciated, No rebound -guarding or rigidity. No Cyanosis, Clubbing or edema, No new Rash or bruise  Data Review   CBC w Diff:  Lab Results  Component Value Date   WBC 9.4 04/09/2018   HGB 12.2 (L) 04/09/2018   HCT 36.5 (L) 04/09/2018   PLT 188 04/09/2018   LYMPHOPCT 2 04/09/2018   MONOPCT 2 04/09/2018   EOSPCT 0 04/09/2018    BASOPCT 0 04/09/2018    CMP:  Lab Results  Component Value Date   NA 142 04/08/2018   K 4.0 04/08/2018   CL 96 (L) 04/08/2018   CO2 36 (H) 04/08/2018   BUN 20 04/08/2018   CREATININE 0.93 04/08/2018   PROT 6.4 (L) 04/07/2018   ALBUMIN 3.6 04/07/2018   BILITOT 1.7 (H) 04/07/2018   ALKPHOS 72 04/07/2018   AST 23 04/07/2018   ALT 10 (L) 04/07/2018  .   Total Time in preparing paper work, data evaluation and todays exam - 35 minutes  Dylan Hamming M.D on 04/12/2018 at 10:03 AM    Note: This dictation was prepared with Dragon dictation along with smaller phrase technology. Any transcriptional errors that result from this process are unintentional.

## 2018-04-12 NOTE — Care Management Important Message (Signed)
Important Message  Patient Details  Name: Dylan Kennedy MRN: [id number] Date of Birth: 11/25/1950   Medicare Important Message Given:  Yes    Kimmie Berggren A, RN 04/12/2018, 11:54 AM

## 2018-04-12 NOTE — Care Management Note (Signed)
Case Management Note  Patient Details  Name: Dylan Kennedy MRN: [id number] Date of Birth: 10/11/1951  Subjective/Objective:  Patient to be discharged today with HHPT. Previous RNCM had initiated set up with [facility]. Per patient it is ok for this RNCM to continue with this plan. [facility]. Hospital bed ordered and is set to be delivered tomorrow per pt preference. Patient on oxygen and tank at bedside. Primary RN notfoed and pt will call transportation soon.  Buddy DutyJosh Sacha Radloff, RN BSN RNCM  [phone]                   Action/Plan:   Expected Discharge Date:  04/12/18               Expected Discharge Plan:  Home w Home Health Services  In-House Referral:     Discharge planning Services  CM Consult  Post Acute Care Choice:    Choice offered to:  Patient, Sibling  DME Arranged:  Oxygen, Specialty bed DME Agency:  [facility]  [facility]     Status of Service:  Completed, signed off  If discussed at Long Length of Stay Meetings, dates discussed:    Additional Comments:  Cleva Camero A, RN 04/12/2018, 11:46 AM

## 2018-04-12 NOTE — Progress Notes (Signed)
Pt provided with discharge instructions. Instructions reviewed. Pt verbalizes understanding to pick up medication from pharmacy and take all abx. This RN spoke to pt's family member who will bring pt's O2 tank to pick pt up from hospital. Pt states he has an O2 concentrator at home and does not need additional tanks. Family aware of situation. SW aware pt to go home. NAD>

## 2018-04-15 ENCOUNTER — Telehealth: Payer: Self-pay

## 2018-04-15 NOTE — Telephone Encounter (Signed)
Flagged on [facility] report for not having a follow up scheduled.  Reviewed chart and his AVS for recent hospital stay does not mention to make an appointment, however the 04/12/18 discharge summary noted for patient to make follow up with PCP within 1 week.  Called and spoke with patient who was not aware of needing a follow up.  Apologized for the instruction being left off his discharge papers, but confirmed that it was on the doctor's note.  He will call and schedule an appointment soon.  No other issues at this time.  I thanked him for his time and and informed him he would get one more automated call in the next few days as a final check-in.  Patient requested call come to his cell number instead of house phone.  A ticket has been submitted to [facility] to get number changed.

## 2018-08-11 DEATH — deceased

## 2019-04-27 IMAGING — DX DG CHEST 1V PORT
2 series · 2 of 2 positions shown · non-contrast
Comparison: Radiograph April 07, 2018.  CT scan February 23, 2016.

CLINICAL DATA: Pneumonia.

EXAM:
PORTABLE CHEST 1 VIEW

[chest ap (1 of 2)]
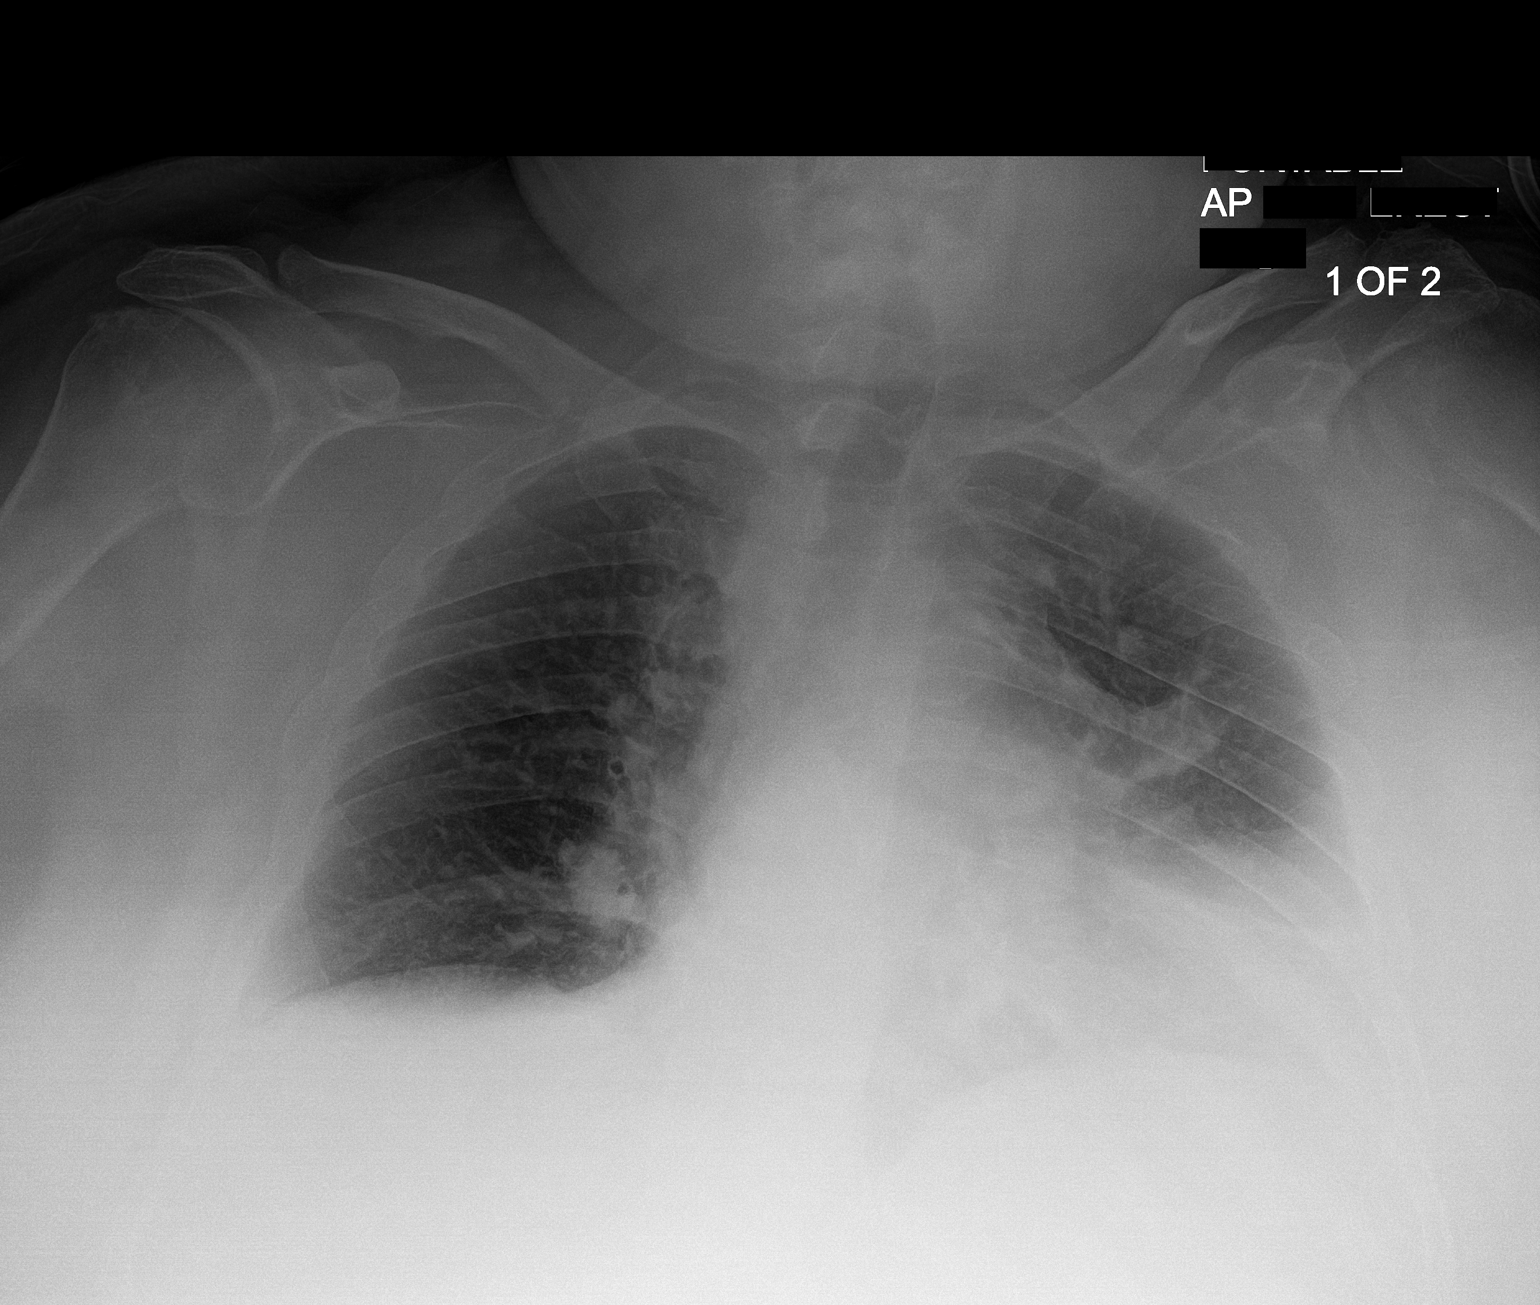

[chest ap (2 of 2)]
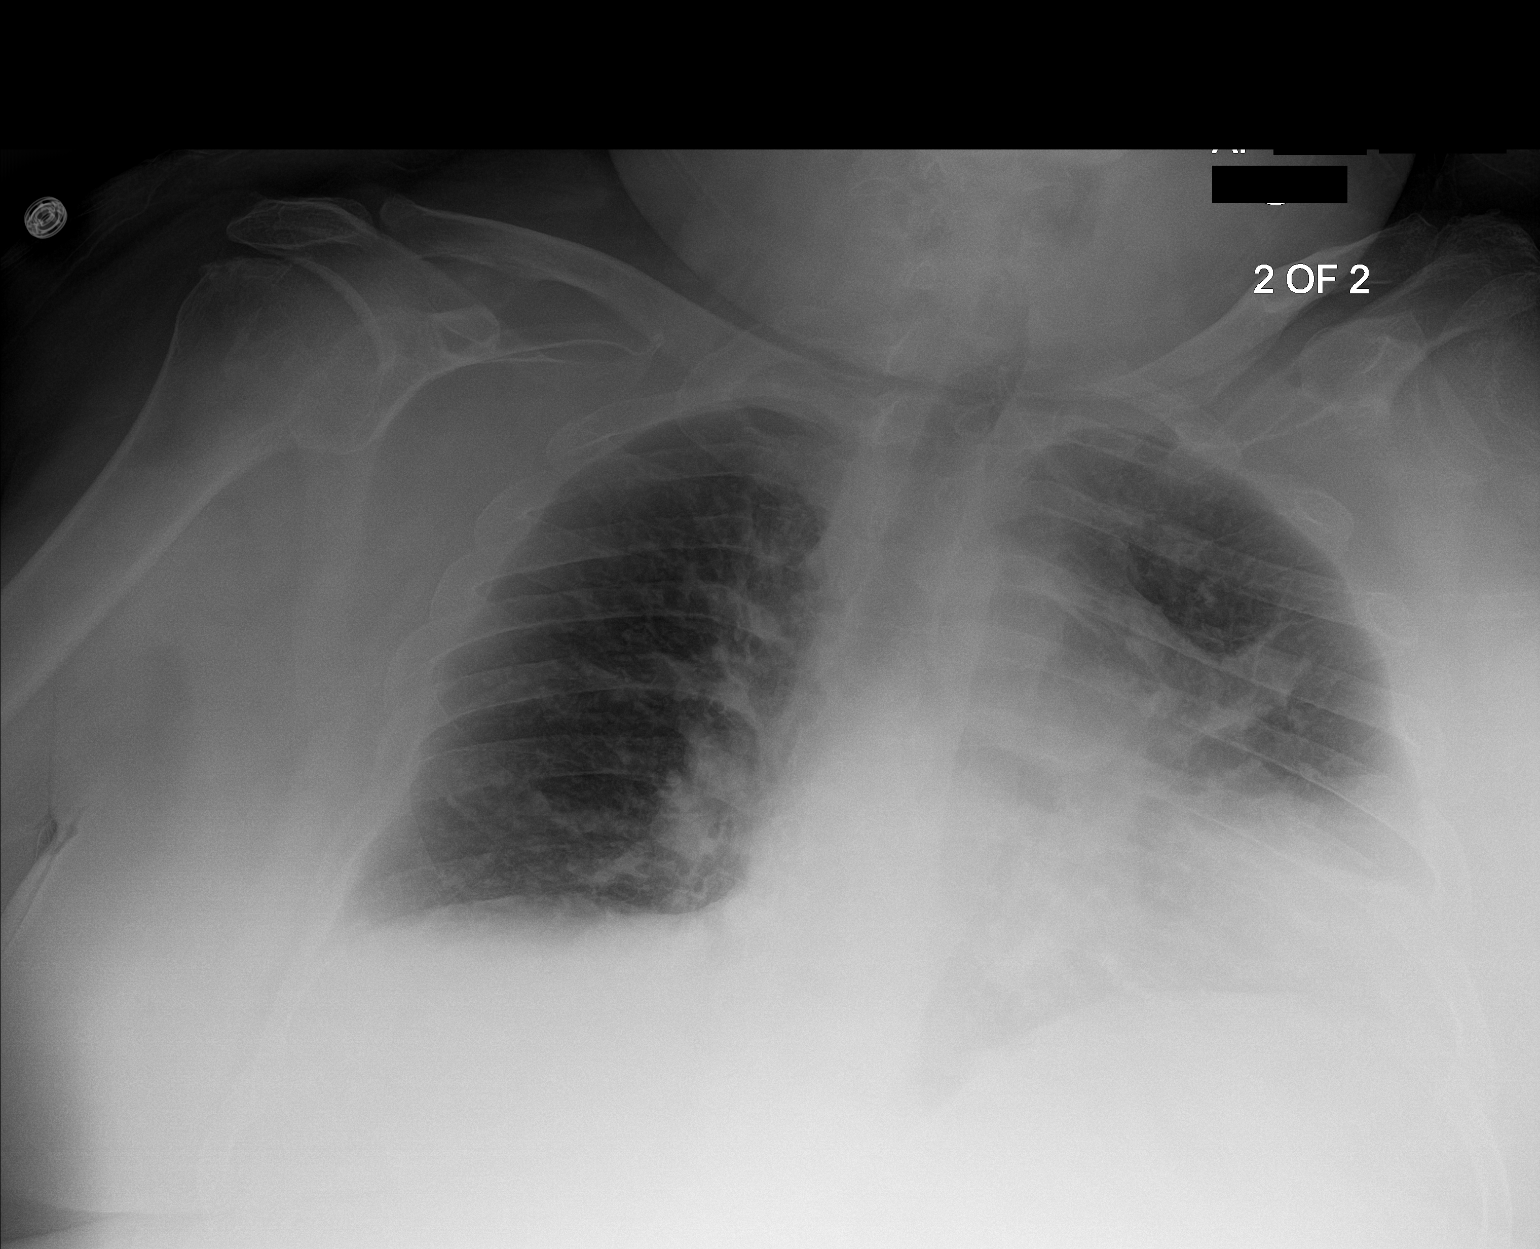

[2 of 2 positions shown; findings below may reference images not displayed]

FINDINGS: Stable cardiomegaly. Right lung is clear. No pneumothorax is noted.
Stable left basilar opacity is noted which may represent prominent
epicardial fat pad. However, increased left upper lobe opacity is
noted concerning for atelectasis or pneumonia. Bony thorax is
unremarkable.
IMPRESSION: New left upper lobe opacity is noted concerning for pneumonia or
atelectasis. Followup PA and lateral chest X-ray is recommended in
3-4 weeks following trial of antibiotic therapy to ensure resolution
and exclude underlying malignancy.
